# Patient Record
Sex: Male | Born: 1990 | Hispanic: Yes | Marital: Single | Smoking: Never smoker
Health system: Southern US, Community
[De-identification: ages and names within clinical notes are randomized; demographics above are authoritative.]

## PROBLEM LIST (undated history)

## (undated) DIAGNOSIS — F419 Anxiety disorder, unspecified: Secondary | ICD-10-CM

## (undated) HISTORY — PX: APPENDECTOMY: SHX54

---
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
## 2014-04-05 ENCOUNTER — Emergency Department: Payer: Self-pay | Admitting: Student

## 2014-04-05 LAB — BASIC METABOLIC PANEL
ANION GAP: 9 (ref 7–16)
BUN: 10 mg/dL (ref 7–18)
CHLORIDE: 102 mmol/L (ref 98–107)
CREATININE: 0.83 mg/dL (ref 0.60–1.30)
Calcium, Total: 9.1 mg/dL (ref 8.5–10.1)
Co2: 27 mmol/L (ref 21–32)
EGFR (Non-African Amer.): >60
Glucose: 121 mg/dL — ABNORMAL HIGH (ref 65–99)
Osmolality: 276 (ref 275–301)
Potassium: 3.7 mmol/L (ref 3.5–5.1)
SODIUM: 138 mmol/L (ref 136–145)

## 2014-04-05 LAB — URINALYSIS, COMPLETE
BLOOD: NEGATIVE
Bacteria: NONE SEEN
Bilirubin,UR: NEGATIVE
Glucose,UR: NEGATIVE mg/dL (ref 0–75)
Leukocyte Esterase: NEGATIVE
Nitrite: NEGATIVE
PH: 6 (ref 4.5–8.0)
Protein: NEGATIVE
RBC, UR: NONE SEEN /HPF (ref 0–5)
SPECIFIC GRAVITY: 1.013 (ref 1.003–1.030)

## 2014-04-05 LAB — CBC WITH DIFFERENTIAL/PLATELET
BASOS ABS: 0.1 10*3/uL (ref 0.0–0.1)
Basophil %: 0.5 %
EOS ABS: 0 10*3/uL (ref 0.0–0.7)
Eosinophil %: 0.1 %
HCT: 46.5 % (ref 40.0–52.0)
HGB: 15.9 g/dL (ref 13.0–18.0)
Lymphocyte #: 1 10*3/uL (ref 1.0–3.6)
Lymphocyte %: 7.9 %
MCH: 30 pg (ref 26.0–34.0)
MCHC: 34.2 g/dL (ref 32.0–36.0)
MCV: 88 fL (ref 80–100)
MONOS PCT: 3.1 %
Monocyte #: 0.4 x10 3/mm (ref 0.2–1.0)
NEUTROS ABS: 11.6 10*3/uL — AB (ref 1.4–6.5)
NEUTROS PCT: 88.4 %
Platelet: 237 10*3/uL (ref 150–440)
RBC: 5.29 10*6/uL (ref 4.40–5.90)
RDW: 13.2 % (ref 11.5–14.5)
WBC: 13.2 10*3/uL — AB (ref 3.8–10.6)

## 2014-04-05 LAB — TROPONIN I

## 2014-06-20 ENCOUNTER — Emergency Department: Admit: 2014-06-20 | Disposition: A | Discharge status: Home / Self Care | Payer: Self-pay | Admitting: Student

## 2015-08-28 ENCOUNTER — Encounter: Payer: Self-pay | Admitting: Emergency Medicine

## 2015-08-28 ENCOUNTER — Emergency Department: Payer: Self-pay

## 2015-08-28 ENCOUNTER — Emergency Department
Admission: EM | Admit: 2015-08-28 | Discharge: 2015-08-28 | Disposition: A | Discharge status: Home / Self Care | Payer: Self-pay | Attending: Emergency Medicine | Admitting: Emergency Medicine

## 2015-08-28 DIAGNOSIS — M791 Myalgia: Secondary | ICD-10-CM | POA: Insufficient documentation

## 2015-08-28 DIAGNOSIS — M7918 Myalgia, other site: Secondary | ICD-10-CM

## 2015-08-28 MED ORDER — CYCLOBENZAPRINE HCL 10 MG PO TABS
10.0000 mg | ORAL_TABLET | Freq: Once | ORAL | Status: AC
Start: 2015-08-28 — End: 2015-08-28
  Administered 2015-08-28: 10 mg via ORAL
  Filled 2015-08-28: qty 1

## 2015-08-28 MED ORDER — CYCLOBENZAPRINE HCL 10 MG PO TABS: 10.0000 mg | ORAL_TABLET | Freq: Three times a day (TID) | ORAL | Status: AC | PRN

## 2015-08-28 NOTE — ED Provider Notes (Signed)
[facility] Emergency Department Provider Note  ____________________________________________  Time seen: 5:30 AM  I have reviewed the triage vital signs and the nursing notes.  History Limited secondary very poor historian HISTORY  Chief Complaint Neck Pain    HPI Devin Rush is a 25 y.o. male presents with 5 out of 10 pain to the base of his neck 2 days that is worse with movement. Patient states that he has tingling in his bilateral hands "feels like my hands a fall asleep". Patient states that he does not have pain at this time "my neck just feels stiff. Patient denies any fever afebrile on presentation temperature 97.9. Patient denies any injury    Past medical history None There are no active problems to display for this patient.   Past surgical history None No current outpatient prescriptions on file.  Allergies No known drug allergies No family history on file.  Social History Social History  Substance Use Topics  . Smoking status: Never Smoker   . Smokeless tobacco: None  . Alcohol Use: No    Review of Systems  Constitutional: Negative for fever. Eyes: Negative for visual changes. ENT: Negative for sore throat.Positive for posterior neck pain Cardiovascular: Negative for chest pain. Respiratory: Negative for shortness of breath. Gastrointestinal: Negative for abdominal pain, vomiting and diarrhea. Genitourinary: Negative for dysuria. Musculoskeletal: Negative for back pain. Skin: Negative for rash. Neurological: Negative for headaches, focal weakness or numbness.   10-point ROS otherwise negative.  ____________________________________________   PHYSICAL EXAM:  VITAL SIGNS: ED Triage Vitals  Enc Vitals Group     BP 08/28/15 0435 135/81 mmHg     Pulse Rate 08/28/15 0435 69     Resp 08/28/15 0435 20     Temp 08/28/15 0435 97.9 F (36.6 C)     Temp Source 08/28/15 0435 Oral     SpO2 08/28/15 0435 100 %      Weight 08/28/15 0435 146 lb (66.225 kg)     Height 08/28/15 0435 5\' 5"  (1.651 m)     Head Cir --      Peak Flow --      Pain Score 08/28/15 0434 6     Pain Loc --      Pain Edu? --      Excl. in GC? --     Constitutional: Alert and oriented. Well appearing and in no distress. Eyes: Conjunctivae are normal. PERRL. Normal extraocular movements. ENT   Head: Normocephalic and atraumatic.   Nose: No congestion/rhinnorhea.   Mouth/Throat: Mucous membranes are moist.   Neck: No stridor. Hematological/Lymphatic/Immunilogical: No cervical lymphadenopathy. Cardiovascular: Normal rate, regular rhythm. Normal and symmetric distal pulses are present in all extremities. No murmurs, rubs, or gallops. Respiratory: Normal respiratory effort without tachypnea nor retractions. Breath sounds are clear and equal bilaterally. No wheezes/rales/rhonchi. Gastrointestinal: Soft and nontender. No distention. There is no CVA tenderness. Genitourinary: deferred Musculoskeletal: Nontender with normal range of motion in all extremities. No joint effusions.  No lower extremity tenderness nor edema. Neurologic:  Normal speech and language. No gross focal neurologic deficits are appreciated. Speech is normal.  Skin:  Skin is warm, dry and intact. No rash noted. Psychiatric: Mood and affect are normal. Speech and behavior are normal. Patient exhibits appropriate insight and judgment.      RADIOLOGY  DG Cervical Spine Complete (Final result) Result time: 08/28/15 06:40:57   Final result by Rad Results In Interface (08/28/15 06:40:57)   Narrative:   CLINICAL DATA: Initial evaluation  for upper neck, C-spine pain posteriorly for 3 days.  EXAM: CERVICAL SPINE - COMPLETE 4+ VIEW  COMPARISON: None.  FINDINGS: Reversal of the normal cervical lordosis with apex at C4. No listhesis or subluxation. Vertebral body heights preserved. No fracture. Normal C1-2 articulations are preserved and the dens  is intact. No significant degenerative changes. No prevertebral soft tissue swelling. Visualized lung apices are clear.  IMPRESSION: 1. No acute fracture or subluxation. 2. Reversal of the normal cervical lordosis, which may be related to positioning or muscular spasm.   Electronically Signed By: Rise MuBenjamin McClintock M.D. On: 08/28/2015 06:40       INITIAL IMPRESSION / ASSESSMENT AND PLAN / ED COURSE  Pertinent labs & imaging results that were available during my care of the patient were reviewed by me and considered in my medical decision making (see chart for details).  Patient given Flexeril 10 mg tablet and emergency department will be prescribed same for home  ____________________________________________   FINAL CLINICAL IMPRESSION(S) / ED DIAGNOSES  Final diagnoses:  Musculoskeletal pain      Darci Currentandolph N Lorilee Cafarella, MD 08/28/15 [phone]

## 2015-08-28 NOTE — ED Notes (Signed)
Pt reports he cannot swallow pills. Called pharmacy to verify ok to crush flexeril - OK per  Nate

## 2015-08-28 NOTE — ED Notes (Signed)
Patient ambulatory to triage with steady gait, without difficulty or distress noted; pt reports x 2 days having pain from base of neck radiating up into back of head and sensation of "fingers falsely falling off"; denies any recent illness, denies HA, denies any injuries; denies hx of same

## 2015-08-28 NOTE — ED Notes (Signed)
Pt reports stiffness to neck area and feeling like he cannot move neck properly.  Pt having no tenderness to touch.  Pt also reports having intermittent feelings of fingers falling asleep.

## 2015-08-28 NOTE — Discharge Instructions (Signed)
Muscle Pain, Adult  Muscle pain (myalgia) may be caused by many things, including:  · Overuse or muscle strain, especially if you are not in shape. This is the most common cause of muscle pain.  · Injury.  · Bruises.  · Viruses, such as the flu.  · Infectious diseases.  · Fibromyalgia, which is a chronic condition that causes muscle tenderness, fatigue, and headache.  · Autoimmune diseases, including lupus.  · Certain drugs, including ACE inhibitors and statins.  Muscle pain may be mild or severe. In most cases, the pain lasts only a short time and goes away without treatment. To diagnose the cause of your muscle pain, your health care provider will take your medical history. This means he or she will ask you when your muscle pain began and what has been happening. If you have not had muscle pain for very long, your health care provider may want to wait before doing much testing. If your muscle pain has lasted a long time, your health care provider may want to run tests right away. If your health care provider thinks your muscle pain may be caused by illness, you may need to have additional tests to rule out certain conditions.   Treatment for muscle pain depends on the cause. Home care is often enough to relieve muscle pain. Your health care provider may also prescribe anti-inflammatory medicine.  HOME CARE INSTRUCTIONS  Watch your condition for any changes. The following actions may help to lessen any discomfort you are feeling:  · Only take over-the-counter or prescription medicines as directed by your health care provider.  · Apply ice to the sore muscle:    Put ice in a plastic bag.    Place a towel between your skin and the bag.    Leave the ice on for 15-20 minutes, 3-4 times a day.  · You may alternate applying hot and cold packs to the muscle as directed by your health care provider.  · If overuse is causing your muscle pain, slow down your activities until the pain goes away.    Remember that it is normal  to feel some muscle pain after starting a workout program. Muscles that have not been used often will be sore at first.    Do regular, gentle exercises if you are not usually active.    Warm up before exercising to lower your risk of muscle pain.  · Do not continue working out if the pain is very bad. Bad pain could mean you have injured a muscle.  SEEK MEDICAL CARE IF:  · Your muscle pain gets worse, and medicines do not help.  · You have muscle pain that lasts longer than 3 days.  · You have a rash or fever along with muscle pain.  · You have muscle pain after a tick bite.  · You have muscle pain while working out, even though you are in good physical condition.  · You have redness, soreness, or swelling along with muscle pain.  · You have muscle pain after starting a new medicine or changing the dose of a medicine.  SEEK IMMEDIATE MEDICAL CARE IF:  · You have trouble breathing.  · You have trouble swallowing.  · You have muscle pain along with a stiff neck, fever, and vomiting.  · You have severe muscle weakness or cannot move part of your body.  MAKE SURE YOU:   · Understand these instructions.  · Will watch your condition.  · Will get   help right away if you are not doing well or get worse.     This information is not intended to replace advice given to you by your health care provider. Make sure you discuss any questions you have with your health care provider.     Document Released: 01/13/2006 Document Revised: 03/14/2014 Document Reviewed: 12/18/2012  Elsevier Interactive Patient Education ©2016 [employer]

## 2017-11-04 ENCOUNTER — Emergency Department
Admission: EM | Admit: 2017-11-04 | Discharge: 2017-11-04 | Disposition: A | Discharge status: Home / Self Care | Payer: Self-pay | Attending: Emergency Medicine | Admitting: Emergency Medicine

## 2017-11-04 ENCOUNTER — Encounter: Payer: Self-pay | Admitting: Emergency Medicine

## 2017-11-04 ENCOUNTER — Other Ambulatory Visit: Payer: Self-pay

## 2017-11-04 DIAGNOSIS — R531 Weakness: Secondary | ICD-10-CM | POA: Insufficient documentation

## 2017-11-04 DIAGNOSIS — R55 Syncope and collapse: Secondary | ICD-10-CM | POA: Insufficient documentation

## 2017-11-04 DIAGNOSIS — R42 Dizziness and giddiness: Secondary | ICD-10-CM | POA: Insufficient documentation

## 2017-11-04 HISTORY — DX: Anxiety disorder, unspecified: F41.9

## 2017-11-04 LAB — CBC
HCT: 47.9 % (ref 40.0–52.0)
HEMOGLOBIN: 16.8 g/dL (ref 13.0–18.0)
MCH: 31.6 pg (ref 26.0–34.0)
MCHC: 35.1 g/dL (ref 32.0–36.0)
MCV: 90 fL (ref 80.0–100.0)
Platelets: 234 10*3/uL (ref 150–440)
RBC: 5.32 MIL/uL (ref 4.40–5.90)
RDW: 13.1 % (ref 11.5–14.5)
WBC: 6 10*3/uL (ref 3.8–10.6)

## 2017-11-04 LAB — URINALYSIS, COMPLETE (UACMP) WITH MICROSCOPIC
BILIRUBIN URINE: NEGATIVE
Bacteria, UA: NONE SEEN
GLUCOSE, UA: NEGATIVE mg/dL
Hgb urine dipstick: NEGATIVE
KETONES UR: NEGATIVE mg/dL
Leukocytes, UA: NEGATIVE
Nitrite: NEGATIVE
PROTEIN: NEGATIVE mg/dL
Specific Gravity, Urine: 1.024 (ref 1.005–1.030)
Squamous Epithelial / LPF: NONE SEEN (ref 0–5)
pH: 6 (ref 5.0–8.0)

## 2017-11-04 LAB — TROPONIN I: Troponin I: <0.03 ng/mL (ref <0.03)

## 2017-11-04 LAB — BASIC METABOLIC PANEL
Anion gap: 9 (ref 5–15)
BUN: 18 mg/dL (ref 6–20)
CALCIUM: 9.5 mg/dL (ref 8.9–10.3)
CO2: 23 mmol/L (ref 22–32)
Chloride: 106 mmol/L (ref 98–111)
Creatinine, Ser: 0.68 mg/dL (ref 0.61–1.24)
GFR calc non Af Amer: >60 mL/min (ref >60)
Glucose, Bld: 108 mg/dL — ABNORMAL HIGH (ref 70–99)
Potassium: 3.5 mmol/L (ref 3.5–5.1)
Sodium: 138 mmol/L (ref 135–145)

## 2017-11-04 NOTE — ED Notes (Signed)
orthostatic v/s  142/94  Laying pulse  80  sitting 133/88  bp pulse 75 Standing  133/93  Pulse 84

## 2017-11-04 NOTE — ED Provider Notes (Signed)
[facility] Emergency Department Provider Note ____________________________________________   First MD Initiated Contact with Patient 11/04/17 1517     (approximate)  I have reviewed the triage vital signs and the nursing notes.   HISTORY  Chief Complaint Weakness  HPI Devin Rush is a 27 y.o. male history of anxiety was presented to the emergency department with 1 week of feeling lightheaded when getting up from a laying down or sitting position.  The patient states that he feels lightheaded for approximately 1 hour after changing positions.  Denies any nausea, vomiting or diarrhea.  He states that he has been having a normal diet, eating and drinking normally.  Denies being anxious as has been a reason for previous ER visit.  Denies any chest pain or shortness of breath.  Says that he has an uncle who died suddenly in his sleep at 126 years old who is overweight but otherwise no other relatives dying suddenly young ages and no known history of arrhythmia in the family.  Patient says that he drinks occasionally including 2 days ago and says that he felt sick yesterday but does not feel any aftereffects today.  Says that he drinks usually once per week.  Does not report any other drug use.  Does not report any burning or frequency with urination.  Patient says that he works in a [employer] and that it is usually cool and not excessively hot.  Past Medical History:  Diagnosis Date  . Anxiety     There are no active problems to display for this patient.   Past Surgical History:  Procedure Laterality Date  . APPENDECTOMY      Prior to Admission medications   Not on File    Allergies Patient has no known allergies.  No family history on file.  Social History Social History   Tobacco Use  . Smoking status: Never Smoker  . Smokeless tobacco: Never Used  Substance Use Topics  . Alcohol use: No  . Drug use: Not on file     Review of Systems  Constitutional: No fever/chills Eyes: No visual changes. ENT: No sore throat. Cardiovascular: Denies chest pain. Respiratory: Denies shortness of breath. Gastrointestinal: No abdominal pain.  No nausea, no vomiting.  No diarrhea.  No constipation. Genitourinary: Negative for dysuria. Musculoskeletal: Negative for back pain. Skin: Negative for rash. Neurological: Negative for headaches, focal weakness or numbness.   ____________________________________________   PHYSICAL EXAM:  VITAL SIGNS: ED Triage Vitals  Enc Vitals Group     BP 11/04/17 1212 120/83     Pulse Rate 11/04/17 1212 85     Resp 11/04/17 1212 16     Temp 11/04/17 1212 98.7 F (37.1 C)     Temp Source 11/04/17 1212 Oral     SpO2 11/04/17 1212 100 %     Weight 11/04/17 1211 150 lb (68 kg)     Height 11/04/17 1211 5\' 4"  (1.626 m)     Head Circumference --      Peak Flow --      Pain Score 11/04/17 1211 0     Pain Loc --      Pain Edu? --      Excl. in GC? --     Constitutional: Alert and oriented. Well appearing and in no acute distress. Eyes: Conjunctivae are normal.  Head: Atraumatic. Nose: No congestion/rhinnorhea. Mouth/Throat: Mucous membranes are moist.  Neck: No stridor.   Cardiovascular: Normal rate, regular rhythm. Grossly normal  heart sounds.   Respiratory: Normal respiratory effort.  No retractions. Lungs CTAB. Gastrointestinal: Soft and nontender. No distention. Musculoskeletal: No lower extremity tenderness nor edema.  No joint effusions. Neurologic:  Normal speech and language. No gross focal neurologic deficits are appreciated. Skin:  Skin is warm, dry and intact. No rash noted. Psychiatric: Mood and affect are normal. Speech and behavior are normal.  ____________________________________________   LABS (all labs ordered are listed, but only abnormal results are displayed)  Labs Reviewed  BASIC METABOLIC PANEL - Abnormal; Notable for the following components:       Result Value   Glucose, Bld 108 (*)    All other components within normal limits  URINALYSIS, COMPLETE (UACMP) WITH MICROSCOPIC - Abnormal; Notable for the following components:   Color, Urine YELLOW (*)    APPearance CLEAR (*)    All other components within normal limits  CBC  TROPONIN I  CBG MONITORING, ED   ____________________________________________  EKG  ED ECG REPORT I, Arelia Longest, the attending physician, personally viewed and interpreted this ECG.   Date: 11/04/2017  EKG Time: 1212  Rate: 80  Rhythm: normal sinus rhythm  Axis: Normal  Intervals:none  ST&T Change: T wave inversions in 3 as well as aVF.  Deep S wave in 2 as well as deep Q wave 3.  No significant changes from previous EKG of 2016 except for very subtle T wave inversion now and aVF.  ____________________________________________  RADIOLOGY   ____________________________________________   PROCEDURES  Procedure(s) performed:   Procedures  Critical Care performed:   ____________________________________________   INITIAL IMPRESSION / ASSESSMENT AND PLAN / ED COURSE  Pertinent labs & imaging results that were available during my care of the patient were reviewed by me and considered in my medical decision making (see chart for details).  DDX: Dehydration, hypotension, near-syncope, electrolyte abnormality, arrhythmia, orthostatic hypotension As part of my medical decision making, I reviewed the following data within the electronic MEDICAL RECORD NUMBER Notes from prior ED visits  ----------------------------------------- 4:24 PM on 11/04/2017 -----------------------------------------  Patient without significant orthostatic changes.  Reassuring labs overall.  I do not see a reason to keep this patient further in the emergency department further testing.  Believe that he can be seen as an outpatient for further work-up.  The patient and I discussed the lab results and further  recommendations to stay hydrated and to decrease the amount of drinking he does that he sometimes admits to drinking heavily.  Patient will follow up with primary care.  To be discharged at this time.  No further complaints for the patient.  Appears well. ____________________________________________   FINAL CLINICAL IMPRESSION(S) / ED DIAGNOSES  Near syncope.  NEW MEDICATIONS STARTED DURING THIS VISIT:  New Prescriptions   No medications on file     Note:  This document was prepared using Dragon voice recognition software and may include unintentional dictation errors.     Myrna Blazer, MD 11/04/17 [phone]

## 2017-11-04 NOTE — ED Triage Notes (Signed)
C/O intermittent lightheadedness x 3 weeks.  States feeling dizzy today.   AAOx3.  Skin warm and dry.  MAE equally and strong.  Gait easy and steady.  NAD

## 2017-11-04 NOTE — ED Notes (Signed)
Pt reports  Weakness and dizziness lightheaded for 3 weeks -  Pt  occasosonal drinker  Had consumed lots of etoh sev days ago  Now is awake and alert ambulated to room with a steady fluid gait

## 2018-09-10 ENCOUNTER — Emergency Department
Admission: EM | Admit: 2018-09-10 | Discharge: 2018-09-10 | Disposition: A | Discharge status: Home / Self Care | Payer: Self-pay | Attending: Emergency Medicine | Admitting: Emergency Medicine

## 2018-09-10 ENCOUNTER — Emergency Department: Payer: Self-pay

## 2018-09-10 ENCOUNTER — Other Ambulatory Visit: Payer: Self-pay

## 2018-09-10 DIAGNOSIS — R079 Chest pain, unspecified: Secondary | ICD-10-CM | POA: Insufficient documentation

## 2018-09-10 DIAGNOSIS — F419 Anxiety disorder, unspecified: Secondary | ICD-10-CM | POA: Insufficient documentation

## 2018-09-10 DIAGNOSIS — R0789 Other chest pain: Secondary | ICD-10-CM

## 2018-09-10 DIAGNOSIS — R202 Paresthesia of skin: Secondary | ICD-10-CM | POA: Insufficient documentation

## 2018-09-10 LAB — CBC
HCT: 45 % (ref 39.0–52.0)
Hemoglobin: 15.6 g/dL (ref 13.0–17.0)
MCH: 30.4 pg (ref 26.0–34.0)
MCHC: 34.7 g/dL (ref 30.0–36.0)
MCV: 87.5 fL (ref 80.0–100.0)
Platelets: 259 10*3/uL (ref 150–400)
RBC: 5.14 MIL/uL (ref 4.22–5.81)
RDW: 13.1 % (ref 11.5–15.5)
WBC: 5.9 10*3/uL (ref 4.0–10.5)
nRBC: 0 % (ref 0.0–0.2)

## 2018-09-10 LAB — BASIC METABOLIC PANEL
Anion gap: 10 (ref 5–15)
BUN: 15 mg/dL (ref 6–20)
CO2: 22 mmol/L (ref 22–32)
Calcium: 9.3 mg/dL (ref 8.9–10.3)
Chloride: 102 mmol/L (ref 98–111)
Creatinine, Ser: 0.66 mg/dL (ref 0.61–1.24)
GFR calc Af Amer: >60 mL/min (ref >60)
GFR calc non Af Amer: >60 mL/min (ref >60)
Glucose, Bld: 107 mg/dL — ABNORMAL HIGH (ref 70–99)
Potassium: 3.8 mmol/L (ref 3.5–5.1)
Sodium: 134 mmol/L — ABNORMAL LOW (ref 135–145)

## 2018-09-10 NOTE — ED Provider Notes (Signed)
[facility] Emergency Department Provider Note   ____________________________________________    I have reviewed the triage vital signs and the nursing notes.   HISTORY  Chief Complaint Numbness and Anxiety     HPI Devin Rush is a 28 y.o. male who presents with complaints of tingling in his fingers bilaterally intermittently over the last 1 to 2 weeks.  He is not sure if this is related to anxiety or not he does have a history of panic attacks.  Earlier today he had a tightness in his chest which lasted 1 to 2 seconds, has not had that again.  Sometimes he notes he has shortness of breath, none now.  No fevers or chills or cough.  No exposure to COVID-19 patients.  No nausea or vomiting.  Currently is asymptomatic.  Past Medical History:  Diagnosis Date  . Anxiety     There are no active problems to display for this patient.   Past Surgical History:  Procedure Laterality Date  . APPENDECTOMY      Prior to Admission medications   Not on File     Allergies Patient has no known allergies.  No family history on file.  Social History Social History   Tobacco Use  . Smoking status: Never Smoker  . Smokeless tobacco: Never Used  Substance Use Topics  . Alcohol use: No  . Drug use: Not on file    Review of Systems  Constitutional: No fever/chills Eyes: No visual changes.  ENT: No neck pain Cardiovascular: Denies chest pain. Respiratory: Denies shortness of breath. Gastrointestinal: No abdominal pain.  No nausea, no vomiting.   Genitourinary: Negative for dysuria. Musculoskeletal: Negative for back pain. Skin: Negative for rash. Neurological: Negative for headaches, as above, no weakness  ____________________________________________   PHYSICAL EXAM:  VITAL SIGNS: ED Triage Vitals  Enc Vitals Group     BP 09/10/18 1308 (!) 141/81     Pulse Rate 09/10/18 1308 98     Resp 09/10/18 1308 18     Temp 09/10/18 1308  99.1 F (37.3 C)     Temp Source 09/10/18 1308 Oral     SpO2 09/10/18 1308 97 %     Weight 09/10/18 1309 69.9 kg (154 lb)     Height 09/10/18 1309 1.6 m (5\' 3" )     Head Circumference --      Peak Flow --      Pain Score 09/10/18 1308 0     Pain Loc --      Pain Edu? --      Excl. in GC? --     Constitutional: Alert and oriented. No acute distress.  Eyes: Conjunctivae are normal.   Nose: No congestion/rhinnorhea. Mouth/Throat: Mucous membranes are moist.   Neck:  Painless ROM Cardiovascular: Normal rate, regular rhythm. Grossly normal heart sounds.  Good peripheral circulation. Respiratory: Normal respiratory effort.  No retractions. Lungs CTAB. Gastrointestinal: Soft and nontender. No distention.    Musculoskeletal:   Warm and well perfused Neurologic:  Normal speech and language. No gross focal neurologic deficits are appreciated.  Cranial nerves II to XII normal Skin:  Skin is warm, dry and intact. No rash noted. Psychiatric: Mood and affect are normal. Speech and behavior are normal.  ____________________________________________   LABS (all labs ordered are listed, but only abnormal results are displayed)  Labs Reviewed  BASIC METABOLIC PANEL - Abnormal; Notable for the following components:      Result Value   Sodium  134 (*)    Glucose, Bld 107 (*)    All other components within normal limits  CBC   ____________________________________________  EKG  ED ECG REPORT I, Lavonia Drafts, the attending physician, personally viewed and interpreted this ECG.  Date: 09/10/2018  Rhythm: normal sinus rhythm QRS Axis: normal Intervals: normal ST/T Wave abnormalities: normal Narrative Interpretation: no evidence of acute ischemia  ____________________________________________  RADIOLOGY  Chest x-ray unremarkable ____________________________________________   PROCEDURES  Procedure(s) performed: No  Procedures   Critical Care performed: No  ____________________________________________   INITIAL IMPRESSION / ASSESSMENT AND PLAN / ED COURSE  Pertinent labs & imaging results that were available during my care of the patient were reviewed by me and considered in my medical decision making (see chart for details).  Patient presents with intermittent paresthesias to the fingertips bilaterally, no neck pain, no neuro deficits at this time.  Brief episode of atypical chest pain, not consistent with ACS, quite reassuring EKG.  Chest x-ray labs overall unremarkable, recommend outpatient follow-up with PCP, neurology as needed.  Return precautions discussed.    ____________________________________________   FINAL CLINICAL IMPRESSION(S) / ED DIAGNOSES  Final diagnoses:  Paresthesias  Atypical chest pain        Note:  This document was prepared using Dragon voice recognition software and may include unintentional dictation errors.   Lavonia Drafts, MD 09/10/18 1459

## 2018-09-10 NOTE — ED Notes (Signed)
See triage note  Presents numbness all over and some tightness in chest  sxs' started yesterday   Hx of panic attacks in past  Dr Corky Downs in with pt

## 2018-09-10 NOTE — ED Triage Notes (Signed)
Pt c/o intermittent positional numbness in BL hands, states yesterday while driving he had some heaviness in his chest and had to pull over because he got scared and upset. States he has a hx of panic attacks but doesn't feel like this is the same. Pt is in NAD.

## 2020-03-30 ENCOUNTER — Emergency Department: Payer: Self-pay

## 2020-03-30 ENCOUNTER — Other Ambulatory Visit: Payer: Self-pay

## 2020-03-30 ENCOUNTER — Emergency Department
Admission: EM | Admit: 2020-03-30 | Discharge: 2020-03-30 | Disposition: A | Discharge status: Home / Self Care | Payer: Self-pay | Attending: Emergency Medicine | Admitting: Emergency Medicine

## 2020-03-30 ENCOUNTER — Encounter: Payer: Self-pay | Admitting: Emergency Medicine

## 2020-03-30 DIAGNOSIS — R109 Unspecified abdominal pain: Secondary | ICD-10-CM | POA: Insufficient documentation

## 2020-03-30 DIAGNOSIS — R0789 Other chest pain: Secondary | ICD-10-CM | POA: Insufficient documentation

## 2020-03-30 LAB — HEPATIC FUNCTION PANEL
ALT: 59 U/L — ABNORMAL HIGH (ref 0–44)
AST: 28 U/L (ref 15–41)
Albumin: 4.4 g/dL (ref 3.5–5.0)
Alkaline Phosphatase: 80 U/L (ref 38–126)
Bilirubin, Direct: 0.1 mg/dL (ref 0.0–0.2)
Indirect Bilirubin: 1 mg/dL — ABNORMAL HIGH (ref 0.3–0.9)
Total Bilirubin: 1.1 mg/dL (ref 0.3–1.2)
Total Protein: 8.1 g/dL (ref 6.5–8.1)

## 2020-03-30 LAB — BASIC METABOLIC PANEL
Anion gap: 12 (ref 5–15)
BUN: 14 mg/dL (ref 6–20)
CO2: 25 mmol/L (ref 22–32)
Calcium: 9.4 mg/dL (ref 8.9–10.3)
Chloride: 100 mmol/L (ref 98–111)
Creatinine, Ser: 0.75 mg/dL (ref 0.61–1.24)
GFR, Estimated: >60 mL/min (ref >60)
Glucose, Bld: 103 mg/dL — ABNORMAL HIGH (ref 70–99)
Potassium: 3.6 mmol/L (ref 3.5–5.1)
Sodium: 137 mmol/L (ref 135–145)

## 2020-03-30 LAB — TROPONIN I (HIGH SENSITIVITY): Troponin I (High Sensitivity): 4 ng/L (ref <18)

## 2020-03-30 LAB — CBC
HCT: 43.9 % (ref 39.0–52.0)
Hemoglobin: 15.3 g/dL (ref 13.0–17.0)
MCH: 30.5 pg (ref 26.0–34.0)
MCHC: 34.9 g/dL (ref 30.0–36.0)
MCV: 87.5 fL (ref 80.0–100.0)
Platelets: 278 10*3/uL (ref 150–400)
RBC: 5.02 MIL/uL (ref 4.22–5.81)
RDW: 12.7 % (ref 11.5–15.5)
WBC: 5.4 10*3/uL (ref 4.0–10.5)
nRBC: 0 % (ref 0.0–0.2)

## 2020-03-30 LAB — LIPASE, BLOOD: Lipase: 27 U/L (ref 11–51)

## 2020-03-30 NOTE — ED Provider Notes (Signed)
[facility] Emergency Department Provider Note   ____________________________________________   Event Date/Time   First MD Initiated Contact with Patient 03/30/20 1250     (approximate)  I have reviewed the triage vital signs and the nursing notes.   HISTORY  Chief Complaint Chest Pain    HPI Devin Rush is a 30 y.o. male reports no significant past medical history  He does have a distant history of appendectomy  Patient reports for about the last couple weeks he has had a few episodes where he will abdominal develop pain in his lower chest in the middle it seems to radiate pain towards his left at times.  It comes about after eats foods.  No nausea or vomiting.  Does not come about during times of exercise, but he does report noticed that when he eats food in particular he will start to get a discomfort in his lower mid chest sometimes radiating discomfort to his left side.  Currently not having symptoms but did have some earlier that occurred while eating a meal at the break room at work.  When this occurs he will start to feel anxious he reports he will start breathing fast, and last week had actually lowered himself to the ground because he thought he is going to pass out during one episode    No history of heart disease.  Does not smoke.  Uses alcohol occasionally but not in a heavy amount  Past Medical History:  Diagnosis Date  . Anxiety     There are no problems to display for this patient.   Past Surgical History:  Procedure Laterality Date  . APPENDECTOMY      Prior to Admission medications   Not on File    Allergies Patient has no known allergies.  No family history on file.  Social History Social History   Tobacco Use  . Smoking status: Never Smoker  . Smokeless tobacco: Never Used  Substance Use Topics  . Alcohol use: No  Occasional alcohol use  Review of Systems Constitutional: No fever/chills Eyes: No  visual changes. ENT: No sore throat. Cardiovascular: See HPI Respiratory: Denies shortness of breath. Gastrointestinal: Some discomfort in his very upper abdomen and his lower chest wall. Genitourinary: Negative for dysuria. Musculoskeletal: Negative for back pain. Skin: Negative for rash. Neurological: Negative for headaches, areas of focal weakness or numbness.    ____________________________________________   PHYSICAL EXAM:  VITAL SIGNS: ED Triage Vitals  Enc Vitals Group     BP 03/30/20 1153 140/88     Pulse Rate 03/30/20 1153 81     Resp 03/30/20 1153 18     Temp 03/30/20 1153 98.4 F (36.9 C)     Temp Source 03/30/20 1153 Oral     SpO2 03/30/20 1153 99 %     Weight 03/30/20 1150 154 lb 1.6 oz (69.9 kg)     Height 03/30/20 1150 5\' 3"  (1.6 m)     Head Circumference --      Peak Flow --      Pain Score 03/30/20 1150 7     Pain Loc --      Pain Edu? --      Excl. in GC? --     Constitutional: Alert and oriented. Well appearing and in no acute distress. Eyes: Conjunctivae are normal. Head: Atraumatic. Nose: No congestion/rhinnorhea. Mouth/Throat: Mucous membranes are moist. Neck: No stridor.  Cardiovascular: Normal rate, regular rhythm. Grossly normal heart sounds.  Good peripheral circulation.  Respiratory: Normal respiratory effort.  No retractions. Lungs CTAB. Gastrointestinal: Soft and nontender. No distention.  Of note the patient currently reports not having pain or discomfort Musculoskeletal: No lower extremity tenderness. Neurologic:  Normal speech and language. No gross focal neurologic deficits are appreciated.  Skin:  Skin is warm, dry and intact. No rash noted. Psychiatric: Mood and affect are normal. Speech and behavior are normal.  ____________________________________________   LABS (all labs ordered are listed, but only abnormal results are displayed)  Labs Reviewed  BASIC METABOLIC PANEL - Abnormal; Notable for the following components:       Result Value   Glucose, Bld 103 (*)    All other components within normal limits  HEPATIC FUNCTION PANEL - Abnormal; Notable for the following components:   ALT 59 (*)    Indirect Bilirubin 1.0 (*)    All other components within normal limits  CBC  LIPASE, BLOOD  TROPONIN I (HIGH SENSITIVITY)   ____________________________________________  EKG  ED ECG REPORT I, Sharyn Creamer, the attending physician, personally viewed and interpreted this ECG.  Date: 03/30/2020 EKG Time: 1145 Rate: 70 Rhythm: normal sinus rhythm QRS Axis: normal Intervals: normal ST/T Wave abnormalities: normal Narrative Interpretation: no evidence of acute ischemia  ____________________________________________  RADIOLOGY  DG Chest 2 View  Result Date: 03/30/2020 CLINICAL DATA:  Chest pain and chest tightness. EXAM: CHEST - 2 VIEW COMPARISON:  09/10/2018 FINDINGS: The heart size and mediastinal contours are within normal limits. Both lungs are clear. The visualized skeletal structures are unremarkable. IMPRESSION: No active cardiopulmonary disease. Electronically Signed   By: Signa Kell M.D.   On: 03/30/2020 12:11   US ABDOMEN LIMITED RUQ (LIVER/GB)  Result Date: 03/30/2020 CLINICAL DATA:  Right upper quadrant abdominal pain for 1 week EXAM: ULTRASOUND ABDOMEN LIMITED RIGHT UPPER QUADRANT COMPARISON:  None. FINDINGS: Gallbladder: No gallstones or wall thickening visualized. No sonographic Murphy sign noted by sonographer. Common bile duct: Diameter: 5 mm Liver: No focal lesion. Diffuse increase in echogenicity. Portal vein is patent on color Doppler imaging with normal direction of blood flow towards the liver. Other: None. IMPRESSION: Diffuse increased echogenicity of the hepatic parenchyma is a nonspecific indicator of hepatocellular dysfunction, most commonly steatosis. Electronically Signed   By: Acquanetta Belling M.D.   On: 03/30/2020 13:47    Chest x-ray normal.  Right upper quadrant ultrasound reviewed,  increased echogenicity of the liver without evidence of acute biliary process ____________________________________________   PROCEDURES  Procedure(s) performed: None  Procedures  Critical Care performed: No  ____________________________________________   INITIAL IMPRESSION / ASSESSMENT AND PLAN / ED COURSE  Pertinent labs & imaging results that were available during my care of the patient were reviewed by me and considered in my medical decision making (see chart for details).   Patient presents for evaluation of episodic lower chest wall versus epigastric discomfort.  Seems to be very much related to eating.  Differential diagnosis would include esophagitis, gastritis, reflux, felt less likely to be pancreatic or hepatocellular disease.  Additionally has symptoms atypical of ACS.  Not related to exercise, but does occasionally radiate discomfort towards his left arm.  No noted risk factors for ACS.  EKG and troponin reassuring  HEAR score < 2.  ----------------------------------------- 2:29 PM on 03/30/2020 -----------------------------------------  Patient resting comfortably, discussed ultrasound findings and my suspicion that his symptoms may be related to gastrointestinal cause such as esophagitis, but I did recommend to him follow-up with cardiology for additional testing given his associated chest discomfort as  well as potentially follow-up with a gastroenterologist.  Patient is currently awaiting visit with primary care doctor which he reports is not able to see for about a month as he is establishing a new PCP now  LFTs slight elevation of ALT.somewhat suspected to have steatosis though exact etiology of hepatic parenchymal disease is not clear at this time.  Patient does understand my recommendation for follow-up with both cardiology and GI  Return precautions and treatment recommendations and follow-up discussed with the patient who is agreeable with the plan.        ____________________________________________   FINAL CLINICAL IMPRESSION(S) / ED DIAGNOSES  Final diagnoses:  Abdominal pain  Atypical chest pain        Note:  This document was prepared using Dragon voice recognition software and may include unintentional dictation errors       Sharyn Creamer, MD 03/30/20 1607

## 2020-03-30 NOTE — Discharge Instructions (Addendum)
Please follow-up with a primary care physician as you are setting this up.  In the interim I do recommend a follow-up with cardiology and gastroenterology to further evaluate cause of your chest pain that seems to be related to eating.  Return to the ER right away if your symptoms worsen, you feel faint or pass out, have trouble breathing, develop a fever, severe pain or other new concerns arise

## 2020-03-30 NOTE — ED Triage Notes (Signed)
Patient to ER for c/o left sided chest pain and tightness to left arm. Patient reports having episode recently (within last week) that he had near syncopal episode and blood pressure was high per EMS to patient at that time. +Mild shortness of breath.

## 2020-03-30 NOTE — ED Notes (Signed)
Pt being discharged from triage. Pt states left sided chest pain that started 3 days ago.

## 2020-05-06 ENCOUNTER — Ambulatory Visit: Payer: Self-pay | Admitting: Adult Health

## 2020-08-05 ENCOUNTER — Other Ambulatory Visit: Payer: Self-pay

## 2020-08-05 NOTE — Progress Notes (Signed)
BP 136/88   Pulse 81   Temp 97.8 F (36.6 C)   Ht 5' 4.88" (1.648 m)   Wt 172 lb 4 oz (78.1 kg)   SpO2 98%   BMI 28.77 kg/m    Subjective:    Patient ID: Devin Rush, male    DOB: Mar 13, 1990, 30 y.o.   MRN: [id number]  HPI: Devin Rush is a 30 y.o. male  Chief Complaint  Patient presents with  . Establish Care  . Gastroesophageal Reflux  . Anxiety   Patient presents to clinic to establish care with new PCP.  Patient reports a history of Anxiety, Depression, and GERD.  Patient denies a history of: Hypertension, Elevated Cholesterol, Thyroid problems, Diabetes, Neurological problems, and Abdominal problems.   ANXIETY/DEPRESSION Patient states he gets in certain situations and that makes him feel anxious.  He has panic attacks when his anxiety heightens.  Patient feels like he has panic attacks at least 1-2 times per week. Patient states he has been in the ER several times.  Patient states he is having trouble with lightheadedness and difficulty breathing. Denies SI.   GAD 7 : Generalized Anxiety Score 08/06/2020  Nervous, Anxious, on Edge 2  Control/stop worrying 2  Worry too much - different things 3  Trouble relaxing 2  Restless 2  Easily annoyed or irritable 2  Afraid - awful might happen 2  Total GAD 7 Score 15  Anxiety Difficulty Somewhat difficult    Flowsheet Row Office Visit from 08/06/2020 in [facility]  PHQ-9 Total Score 13      GERD  Patient states he has tried pepcid and tums. The Pepcid works but does not take it away completely.  Patient states it started as certain foods irritated him.  But now he feels like all foods are causing him to have reflux.   Relevant past medical, surgical, family and social history reviewed and updated as indicated. Interim medical history since our last visit reviewed. Allergies and medications reviewed and updated.  Review of Systems  Gastrointestinal:       Heartburn    Per HPI unless  specifically indicated above     Objective:    BP 136/88   Pulse 81   Temp 97.8 F (36.6 C)   Ht 5' 4.88" (1.648 m)   Wt 172 lb 4 oz (78.1 kg)   SpO2 98%   BMI 28.77 kg/m   Wt Readings from Last 3 Encounters:  08/06/20 172 lb 4 oz (78.1 kg)  09/10/18 154 lb (69.9 kg)  11/04/17 150 lb (68 kg)    Physical Exam Vitals and nursing note reviewed.  Constitutional:      General: He is not in acute distress.    Appearance: Normal appearance. He is not ill-appearing, toxic-appearing or diaphoretic.  HENT:     Head: Normocephalic.     Right Ear: External ear normal.     Left Ear: External ear normal.     Nose: Nose normal. No congestion or rhinorrhea.     Mouth/Throat:     Mouth: Mucous membranes are moist.  Eyes:     General:        Right eye: No discharge.        Left eye: No discharge.     Extraocular Movements: Extraocular movements intact.     Conjunctiva/sclera: Conjunctivae normal.     Pupils: Pupils are equal, round, and reactive to light.  Cardiovascular:     Rate and Rhythm: Normal  rate and regular rhythm.     Heart sounds: No murmur heard.   Pulmonary:     Effort: Pulmonary effort is normal. No respiratory distress.     Breath sounds: Normal breath sounds. No wheezing, rhonchi or rales.  Abdominal:     General: Abdomen is flat. Bowel sounds are normal.  Musculoskeletal:     Cervical back: Normal range of motion and neck supple.  Skin:    General: Skin is warm and dry.     Capillary Refill: Capillary refill takes less than 2 seconds.  Neurological:     General: No focal deficit present.     Mental Status: He is alert and oriented to person, place, and time.  Psychiatric:        Mood and Affect: Mood normal.        Behavior: Behavior normal.        Thought Content: Thought content normal.        Judgment: Judgment normal.     Results for orders placed or performed during the hospital encounter of 09/10/18  CBC  Result Value Ref Range   WBC 5.9 4.0 -  10.5 K/uL   RBC 5.14 4.22 - 5.81 MIL/uL   Hemoglobin 15.6 13.0 - 17.0 g/dL   HCT 21.1 94.1 - 74.0 %   MCV 87.5 80.0 - 100.0 fL   MCH 30.4 26.0 - 34.0 pg   MCHC 34.7 30.0 - 36.0 g/dL   RDW 81.4 48.1 - 85.6 %   Platelets 259 150 - 400 K/uL   nRBC 0.0 0.0 - 0.2 %  Basic metabolic panel  Result Value Ref Range   Sodium 134 (L) 135 - 145 mmol/L   Potassium 3.8 3.5 - 5.1 mmol/L   Chloride 102 98 - 111 mmol/L   CO2 22 22 - 32 mmol/L   Glucose, Bld 107 (H) 70 - 99 mg/dL   BUN 15 6 - 20 mg/dL   Creatinine, Ser 3.14 0.61 - 1.24 mg/dL   Calcium 9.3 8.9 - 97.0 mg/dL   GFR calc non Af Amer >60 >60 mL/min   GFR calc Af Amer >60 >60 mL/min   Anion gap 10 5 - 15      Assessment & Plan:   Problem List Items Addressed This Visit      Digestive   Hepatic steatosis    Chronic.  Stable.  Discussed during visit today.  Recommend decreasing fat in diet. Will draw labs at next visit.       GERD (gastroesophageal reflux disease)    Chronic.  Ongoing.  Patient has tried pepcid and tums. Recommend using Omeprazole daily for the next 30 days.  Once he has completed the 30 days, recommend patient use medication PRN. Patient understands and agrees with the plan of care.  Follow up in 1 month for reevaluation.      Relevant Medications   omeprazole (PRILOSEC) 20 MG capsule     Other   Anxiety and depression - Primary    Chronic.  Uncontrolled. Recommend starting Zoloft 25mg  daily. Side effects and benefits of medication discussed with patient during visit today. Discussed adding Buspar to regimen to help with panic attacks. Patient would like to start with Zoloft and think about adding Buspar in the future. Return to clinic in 1 month for reevaluation.       Relevant Medications   sertraline (ZOLOFT) 25 MG tablet    Other Visit Diagnoses    Encounter to establish care  Return in 1 month for Physical and fasting labs.        Follow up plan: Return in about 1 month (around 09/05/2020) for  Physical and Fasting labs, Depression/Anxiety FU, GERD.

## 2020-08-06 ENCOUNTER — Encounter: Payer: Self-pay | Admitting: Nurse Practitioner

## 2020-08-06 ENCOUNTER — Ambulatory Visit: Payer: 59 | Admitting: Nurse Practitioner

## 2020-08-06 VITALS — BP 136/88 | HR 81 | Temp 97.8°F | Ht 64.88 in | Wt 172.2 lb

## 2020-08-06 DIAGNOSIS — Z7689 Persons encountering health services in other specified circumstances: Secondary | ICD-10-CM | POA: Diagnosis not present

## 2020-08-06 DIAGNOSIS — K219 Gastro-esophageal reflux disease without esophagitis: Secondary | ICD-10-CM | POA: Insufficient documentation

## 2020-08-06 DIAGNOSIS — F419 Anxiety disorder, unspecified: Secondary | ICD-10-CM | POA: Diagnosis not present

## 2020-08-06 DIAGNOSIS — K760 Fatty (change of) liver, not elsewhere classified: Secondary | ICD-10-CM | POA: Diagnosis not present

## 2020-08-06 DIAGNOSIS — F32A Depression, unspecified: Secondary | ICD-10-CM

## 2020-08-06 MED ORDER — SERTRALINE HCL 25 MG PO TABS
25.0000 mg | ORAL_TABLET | Freq: Every day | ORAL | 0 refills | Status: DC
Start: 2020-08-06 — End: 2020-09-08

## 2020-08-06 MED ORDER — OMEPRAZOLE 20 MG PO CPDR
20.0000 mg | DELAYED_RELEASE_CAPSULE | Freq: Every day | ORAL | 0 refills | Status: DC
Start: 2020-08-06 — End: 2020-09-08

## 2020-08-06 NOTE — Patient Instructions (Signed)
https://www.nimh.nih.gov/health/topics/anxiety-disorders/index.shtml">  Panic Attack A panic attack is when you suddenly feel very afraid, uncomfortable, or nervous (anxious). A panic attack can happen when you are scared or for no reason. A panic attack can feel like a serious problem. It can even feel like a heart attack or stroke. See your doctor when you have a panic attack to make sure you do not have a serious problem. Follow these instructions at home:  Take medicines only as told by your doctor.  If you feel worried or nervous, try not to have caffeine.  Take good care of your health. To do this: ? Eat healthy. Make sure to eat fresh fruits and vegetables, whole grains, lean meats, and low-fat dairy. ? Get enough sleep. Try to sleep for 7-8 hours each night. ? Exercise. Try to be active for 30 minutes 5 or more days a week. ? Do not smoke. Talk to your doctor if you need help quitting. ? Limit how much alcohol you drink:  If you are a woman who is not pregnant: try not to have more than 1 drink a day.  If you are a man: try not to have more than 2 drinks a day.  One drink equals 12 oz of beer, 5 oz of wine, or 1 oz of hard liquor.  Keep all follow-up visits as told by your doctor. This is important.   Contact a doctor if:  Your symptoms do not get better.  Your symptoms get worse.  You are not able to take your medicines as told. Get help right away if:  You have thoughts of hurting yourself or others.  You have symptoms of a panic attack. Do not drive yourself to the hospital. Have someone else drive you or call an ambulance. If you feel like you may hurt yourself or others, or have thoughts about taking your own life, get help right away. You can go to your nearest emergency department or call:  Your local emergency services (911 in the [place]  A suicide crisis helpline, such as the National Suicide Prevention Lifeline at 1-800-273-8255. This is open 24 hours a  day. Summary  A panic attack is when you suddenly feel very afraid, uncomfortable, or nervous (anxious).  See your doctor when you have a panic attack to make sure that you do not have another serious problem.  If you feel like you may hurt yourself or others, get help right away by calling 911. This information is not intended to replace advice given to you by your health care provider. Make sure you discuss any questions you have with your health care provider. Document Revised: 08/22/2019 Document Reviewed: 08/22/2019 Elsevier Patient Education  2021 [employer]  

## 2020-08-06 NOTE — Assessment & Plan Note (Addendum)
Chronic.  Uncontrolled. Recommend starting Zoloft 25mg  daily. Side effects and benefits of medication discussed with patient during visit today. Discussed adding Buspar to regimen to help with panic attacks. Patient would like to start with Zoloft and think about adding Buspar in the future. Return to clinic in 1 month for reevaluation.

## 2020-08-06 NOTE — Assessment & Plan Note (Signed)
Chronic.  Ongoing.  Patient has tried pepcid and tums. Recommend using Omeprazole daily for the next 30 days.  Once he has completed the 30 days, recommend patient use medication PRN. Patient understands and agrees with the plan of care.  Follow up in 1 month for reevaluation.

## 2020-08-06 NOTE — Assessment & Plan Note (Addendum)
Chronic.  Stable.  Discussed during visit today.  Recommend decreasing fat in diet. Will draw labs at next visit.

## 2020-09-07 NOTE — Progress Notes (Addendum)
BP 128/81   Pulse 77   Temp 98.2 F (36.8 C)   Ht 5' 4.65" (1.642 m)   Wt 169 lb 2 oz (76.7 kg)   SpO2 98%   BMI 28.45 kg/m    Subjective:    Patient ID: Devin Rush, male    DOB: 11/08/1990, 30 y.o.   MRN: [id number]  HPI: Devin Rush is a 30 y.o. male presenting on 09/08/2020 for comprehensive medical examination. Current medical complaints include:none  He currently lives with: Interim Problems from his last visit: no  GERD GERD control status: better Satisfied with current treatment?  Could be better Heartburn frequency:  Medication side effects: no  Medication compliance: stable Previous GERD medications: Antacid use frequency:   Duration:  Nature:  Location:  Heartburn duration:  Alleviatiating factors:   Aggravating factors:  Dysphagia: no Odynophagia:  no Hematemesis: no Blood in stool: no EGD: no  DEPRESSION/ANXIETY Patient states he feels like his panic attacks are a lot better. He has only had one panic attack since starting on the Zoloft.  Does feel like increasing the dose would be beneficial. Denies SI.   GAD 7 : Generalized Anxiety Score 09/08/2020 08/06/2020  Nervous, Anxious, on Edge 1 2  Control/stop worrying 1 2  Worry too much - different things 1 3  Trouble relaxing 0 2  Restless 0 2  Easily annoyed or irritable 0 2  Afraid - awful might happen 0 2  Total GAD 7 Score 3 15  Anxiety Difficulty Not difficult at all Somewhat difficult       Depression Screen done today and results listed below:  Depression screen [facility] 2/9 09/08/2020 08/06/2020  Decreased Interest 0 1  Down, Depressed, Hopeless 1 1  PHQ - 2 Score 1 2  Altered sleeping 1 2  Tired, decreased energy 1 2  Change in appetite 0 2  Feeling bad or failure about yourself  0 2  Trouble concentrating 0 1  Moving slowly or fidgety/restless 0 2  Suicidal thoughts 0 0  PHQ-9 Score 3 13  Difficult doing work/chores Not difficult at all Somewhat difficult    The  patient does not have a history of falls. I did complete a risk assessment for falls. A plan of care for falls was documented.   Past Medical History:  Past Medical History:  Diagnosis Date   Anxiety     Surgical History:  Past Surgical History:  Procedure Laterality Date   APPENDECTOMY      Medications:  No current outpatient medications on file prior to visit.   No current facility-administered medications on file prior to visit.    Allergies:  No Known Allergies  Social History:  Social History   Socioeconomic History   Marital status: Single    Spouse name: Not on file   Number of children: Not on file   Years of education: Not on file   Highest education level: Not on file  Occupational History   Not on file  Tobacco Use   Smoking status: Never   Smokeless tobacco: Never  Vaping Use   Vaping Use: Never used  Substance and Sexual Activity   Alcohol use: Yes    Comment: on occasion   Drug use: Never   Sexual activity: Yes  Other Topics Concern   Not on file  Social History Narrative   Not on file   Social Determinants of Health   Financial Resource Strain: Not on file  Food Insecurity: Not  on file  Transportation Needs: Not on file  Physical Activity: Not on file  Stress: Not on file  Social Connections: Not on file  Intimate Partner Violence: Not on file   Social History   Tobacco Use  Smoking Status Never  Smokeless Tobacco Never   Social History   Substance and Sexual Activity  Alcohol Use Yes   Comment: on occasion    Family History:  Family History  Problem Relation Age of Onset   Kidney disease Mother    Hypertension Mother    Diabetes Father    Blindness Father    Diabetes Maternal Grandmother    Blindness Paternal Grandmother    Kidney disease Paternal Grandmother    Kidney disease Paternal Grandfather     Past medical history, surgical history, medications, allergies, family history and social history reviewed with  patient today and changes made to appropriate areas of the chart.   Review of Systems  Gastrointestinal:  Positive for heartburn.  Psychiatric/Behavioral:  Positive for depression. Negative for suicidal ideas. The patient is nervous/anxious.   All other ROS negative except what is listed above and in the HPI.      Objective:    BP 128/81   Pulse 77   Temp 98.2 F (36.8 C)   Ht 5' 4.65" (1.642 m)   Wt 169 lb 2 oz (76.7 kg)   SpO2 98%   BMI 28.45 kg/m   Wt Readings from Last 3 Encounters:  09/08/20 169 lb 2 oz (76.7 kg)  08/06/20 172 lb 4 oz (78.1 kg)  09/10/18 154 lb (69.9 kg)    Physical Exam Vitals and nursing note reviewed.  Constitutional:      General: He is not in acute distress.    Appearance: Normal appearance. He is not ill-appearing, toxic-appearing or diaphoretic.  HENT:     Head: Normocephalic.     Right Ear: Tympanic membrane, ear canal and external ear normal.     Left Ear: Tympanic membrane, ear canal and external ear normal.     Nose: Nose normal. No congestion or rhinorrhea.     Mouth/Throat:     Mouth: Mucous membranes are moist.  Eyes:     General:        Right eye: No discharge.        Left eye: No discharge.     Extraocular Movements: Extraocular movements intact.     Conjunctiva/sclera: Conjunctivae normal.     Pupils: Pupils are equal, round, and reactive to light.  Cardiovascular:     Rate and Rhythm: Normal rate and regular rhythm.     Heart sounds: No murmur heard. Pulmonary:     Effort: Pulmonary effort is normal. No respiratory distress.     Breath sounds: Normal breath sounds. No wheezing, rhonchi or rales.  Abdominal:     General: Abdomen is flat. Bowel sounds are normal. There is no distension.     Palpations: Abdomen is soft.     Tenderness: There is no abdominal tenderness. There is no guarding.  Musculoskeletal:     Cervical back: Normal range of motion and neck supple.  Skin:    General: Skin is warm and dry.     Capillary  Refill: Capillary refill takes less than 2 seconds.  Neurological:     General: No focal deficit present.     Mental Status: He is alert and oriented to person, place, and time.     Cranial Nerves: No cranial nerve deficit.  Motor: No weakness.     Deep Tendon Reflexes: Reflexes normal.  Psychiatric:        Mood and Affect: Mood normal.        Behavior: Behavior normal.        Thought Content: Thought content normal.        Judgment: Judgment normal.    Results for orders placed or performed in visit on 09/08/20  TSH  Result Value Ref Range   TSH 1.550 0.450 - 4.500 uIU/mL  Lipid panel  Result Value Ref Range   Cholesterol, Total 226 (H) 100 - 199 mg/dL   Triglycerides 473 (H) 0 - 149 mg/dL   HDL 27 (L) >39 mg/dL   VLDL Cholesterol Cal 83 (H) 5 - 40 mg/dL   LDL Chol Calc (NIH) 116 (H) 0 - 99 mg/dL   Chol/HDL Ratio 8.4 (H) 0.0 - 5.0 ratio  CBC with Differential/Platelet  Result Value Ref Range   WBC 5.3 3.4 - 10.8 x10E3/uL   RBC 5.38 4.14 - 5.80 x10E6/uL   Hemoglobin 16.2 13.0 - 17.7 g/dL   Hematocrit 47.5 37.5 - 51.0 %   MCV 88 79 - 97 fL   MCH 30.1 26.6 - 33.0 pg   MCHC 34.1 31.5 - 35.7 g/dL   RDW 13.1 11.6 - 15.4 %   Platelets 294 150 - 450 x10E3/uL   Neutrophils 51 Not Estab. %   Lymphs 40 Not Estab. %   Monocytes 6 Not Estab. %   Eos 2 Not Estab. %   Basos 1 Not Estab. %   Neutrophils Absolute 2.7 1.4 - 7.0 x10E3/uL   Lymphocytes Absolute 2.1 0.7 - 3.1 x10E3/uL   Monocytes Absolute 0.3 0.1 - 0.9 x10E3/uL   EOS (ABSOLUTE) 0.1 0.0 - 0.4 x10E3/uL   Basophils Absolute 0.0 0.0 - 0.2 x10E3/uL   Immature Granulocytes 0 Not Estab. %   Immature Grans (Abs) 0.0 0.0 - 0.1 x10E3/uL  Comprehensive metabolic panel  Result Value Ref Range   Glucose 97 65 - 99 mg/dL   BUN 12 6 - 20 mg/dL   Creatinine, Ser 0.81 0.76 - 1.27 mg/dL   eGFR 122 >59 mL/min/1.73   BUN/Creatinine Ratio 15 9 - 20   Sodium 136 134 - 144 mmol/L   Potassium 4.2 3.5 - 5.2 mmol/L   Chloride 98 96 -  106 mmol/L   CO2 22 20 - 29 mmol/L   Calcium 9.9 8.7 - 10.2 mg/dL   Total Protein 8.0 6.0 - 8.5 g/dL   Albumin 4.9 4.1 - 5.2 g/dL   Globulin, Total 3.1 1.5 - 4.5 g/dL   Albumin/Globulin Ratio 1.6 1.2 - 2.2   Bilirubin Total 0.4 0.0 - 1.2 mg/dL   Alkaline Phosphatase 109 44 - 121 IU/L   AST 22 0 - 40 IU/L   ALT 54 (H) 0 - 44 IU/L  Urinalysis, Routine w reflex microscopic  Result Value Ref Range   Specific Gravity, UA 1.025 1.005 - 1.030   pH, UA 5.0 5.0 - 7.5   Color, UA Yellow Yellow   Appearance Ur Clear Clear   Leukocytes,UA Negative Negative   Protein,UA Negative Negative/Trace   Glucose, UA Negative Negative   Ketones, UA Negative Negative   RBC, UA Negative Negative   Bilirubin, UA Negative Negative   Urobilinogen, Ur 0.2 0.2 - 1.0 mg/dL   Nitrite, UA Negative Negative  HIV Antibody (routine testing w rflx)  Result Value Ref Range   HIV Screen 4th Generation wRfx Non Reactive  Non Reactive  Hepatitis C Antibody  Result Value Ref Range   Hep C Virus Ab <0.1 0.0 - 0.9 s/co ratio      Assessment & Plan:   Problem List Items Addressed This Visit       Digestive   GERD (gastroesophageal reflux disease)    Chronic.  Improving. Improved from last visit but patient still has some heartburn after every meal.  Will increase Omeprazole to 46m daily. If symptoms persist can switch to Protonix. Follow up in 6 weeks for reevaluation. Call sooner if concerns arise.        Relevant Medications   omeprazole (PRILOSEC) 40 MG capsule     Other   Anxiety and depression    Chronic.  Improving. Patient agrees that increasing the Zoloft to 537mwill continue to improve his symptoms.  Sent to the pharmacy. Follow up in 6 weeks for reevaluation. Call sooner if concerns arise.        Relevant Medications   sertraline (ZOLOFT) 50 MG tablet   Other Visit Diagnoses     Annual physical exam    -  Primary   Health maintenance reviewed during visit today. Labs ordered. TDAP given.     Relevant Orders   TSH (Completed)   Lipid panel (Completed)   CBC with Differential/Platelet (Completed)   Comprehensive metabolic panel (Completed)   Urinalysis, Routine w reflex microscopic (Completed)   Encounter for hepatitis C screening test for low risk patient       Relevant Orders   Hepatitis C Antibody (Completed)   Screening for HIV (human immunodeficiency virus)       Relevant Orders   HIV Antibody (routine testing w rflx) (Completed)   Immunization due       Relevant Orders   Tdap vaccine greater than or equal to 7yo IM (Completed)        Discussed aspirin prophylaxis for myocardial infarction prevention and decision was it was not indicated  LABORATORY TESTING:  Health maintenance labs ordered today as discussed above.   The natural history of prostate cancer and ongoing controversy regarding screening and potential treatment outcomes of prostate cancer has been discussed with the patient. The meaning of a false positive PSA and a false negative PSA has been discussed. He indicates understanding of the limitations of this screening test and wishes not to proceed with screening PSA testing.   IMMUNIZATIONS:   - Tdap: Tetanus vaccination status reviewed: last tetanus booster within 10 years. - Influenza: Postponed to flu season - Pneumovax: Not applicable - Prevnar: Not applicable - HPV:  would like to wait until next visit - Zostavax vaccine: Not applicable  SCREENING: - Colonoscopy: Not applicable  Discussed with patient purpose of the colonoscopy is to detect colon cancer at curable precancerous or early stages   - AAA Screening: Not applicable  -Hearing Test: Not applicable  -Spirometry: Not applicable   PATIENT COUNSELING:    Sexuality: Discussed sexually transmitted diseases, partner selection, use of condoms, avoidance of unintended pregnancy  and contraceptive alternatives.   Advised to avoid cigarette smoking.  I discussed with the patient that  most people either abstain from alcohol or drink within safe limits (<=14/week and <=4 drinks/occasion for males, <=7/weeks and <= 3 drinks/occasion for females) and that the risk for alcohol disorders and other health effects rises proportionally with the number of drinks per week and how often a drinker exceeds daily limits.  Discussed cessation/primary prevention of drug use and availability of treatment for  abuse.   Diet: Encouraged to adjust caloric intake to maintain  or achieve ideal body weight, to reduce intake of dietary saturated fat and total fat, to limit sodium intake by avoiding high sodium foods and not adding table salt, and to maintain adequate dietary potassium and calcium preferably from fresh fruits, vegetables, and low-fat dairy products.    stressed the importance of regular exercise  Injury prevention: Discussed safety belts, safety helmets, smoke detector, smoking near bedding or upholstery.   Dental health: Discussed importance of regular tooth brushing, flossing, and dental visits.   Follow up plan: NEXT PREVENTATIVE PHYSICAL DUE IN 1 YEAR. Return in about 6 weeks (around 10/20/2020) for Depression/Anxiety FU, GERD.

## 2020-09-08 ENCOUNTER — Encounter: Payer: Self-pay | Admitting: Nurse Practitioner

## 2020-09-08 ENCOUNTER — Ambulatory Visit (INDEPENDENT_AMBULATORY_CARE_PROVIDER_SITE_OTHER): Payer: 59 | Admitting: Nurse Practitioner

## 2020-09-08 ENCOUNTER — Other Ambulatory Visit: Payer: Self-pay

## 2020-09-08 VITALS — BP 128/81 | HR 77 | Temp 98.2°F | Ht 64.65 in | Wt 169.1 lb

## 2020-09-08 DIAGNOSIS — Z114 Encounter for screening for human immunodeficiency virus [HIV]: Secondary | ICD-10-CM

## 2020-09-08 DIAGNOSIS — Z0000 Encounter for general adult medical examination without abnormal findings: Secondary | ICD-10-CM | POA: Diagnosis not present

## 2020-09-08 DIAGNOSIS — Z23 Encounter for immunization: Secondary | ICD-10-CM | POA: Diagnosis not present

## 2020-09-08 DIAGNOSIS — F32A Depression, unspecified: Secondary | ICD-10-CM | POA: Diagnosis not present

## 2020-09-08 DIAGNOSIS — K219 Gastro-esophageal reflux disease without esophagitis: Secondary | ICD-10-CM

## 2020-09-08 DIAGNOSIS — F419 Anxiety disorder, unspecified: Secondary | ICD-10-CM

## 2020-09-08 DIAGNOSIS — Z1159 Encounter for screening for other viral diseases: Secondary | ICD-10-CM

## 2020-09-08 LAB — URINALYSIS, ROUTINE W REFLEX MICROSCOPIC
Bilirubin, UA: NEGATIVE
Glucose, UA: NEGATIVE
Ketones, UA: NEGATIVE
Leukocytes,UA: NEGATIVE
Nitrite, UA: NEGATIVE
Protein,UA: NEGATIVE
RBC, UA: NEGATIVE
Specific Gravity, UA: 1.025 (ref 1.005–1.030)
Urobilinogen, Ur: 0.2 mg/dL (ref 0.2–1.0)
pH, UA: 5 (ref 5.0–7.5)

## 2020-09-08 MED ORDER — OMEPRAZOLE 40 MG PO CPDR: 40.0000 mg | DELAYED_RELEASE_CAPSULE | Freq: Every day | ORAL | 1 refills | Status: DC

## 2020-09-08 MED ORDER — SERTRALINE HCL 50 MG PO TABS: 50.0000 mg | ORAL_TABLET | Freq: Every day | ORAL | 1 refills | Status: DC

## 2020-09-08 NOTE — Assessment & Plan Note (Signed)
Chronic.  Improving. Patient agrees that increasing the Zoloft to 50mg  will continue to improve his symptoms.  Sent to the pharmacy. Follow up in 6 weeks for reevaluation. Call sooner if concerns arise.

## 2020-09-08 NOTE — Assessment & Plan Note (Signed)
Chronic.  Improving. Improved from last visit but patient still has some heartburn after every meal.  Will increase Omeprazole to 40mg  daily. If symptoms persist can switch to Protonix. Follow up in 6 weeks for reevaluation. Call sooner if concerns arise.

## 2020-09-09 LAB — COMPREHENSIVE METABOLIC PANEL
ALT: 54 IU/L — ABNORMAL HIGH (ref 0–44)
AST: 22 IU/L (ref 0–40)
Albumin/Globulin Ratio: 1.6 (ref 1.2–2.2)
Albumin: 4.9 g/dL (ref 4.1–5.2)
Alkaline Phosphatase: 109 IU/L (ref 44–121)
BUN/Creatinine Ratio: 15 (ref 9–20)
BUN: 12 mg/dL (ref 6–20)
Bilirubin Total: 0.4 mg/dL (ref 0.0–1.2)
CO2: 22 mmol/L (ref 20–29)
Calcium: 9.9 mg/dL (ref 8.7–10.2)
Chloride: 98 mmol/L (ref 96–106)
Creatinine, Ser: 0.81 mg/dL (ref 0.76–1.27)
Globulin, Total: 3.1 g/dL (ref 1.5–4.5)
Glucose: 97 mg/dL (ref 65–99)
Potassium: 4.2 mmol/L (ref 3.5–5.2)
Sodium: 136 mmol/L (ref 134–144)
Total Protein: 8 g/dL (ref 6.0–8.5)
eGFR: 122 mL/min/{1.73_m2} (ref >59)

## 2020-09-09 LAB — CBC WITH DIFFERENTIAL/PLATELET
Basophils Absolute: 0 10*3/uL (ref 0.0–0.2)
Basos: 1 %
EOS (ABSOLUTE): 0.1 10*3/uL (ref 0.0–0.4)
Eos: 2 %
Hematocrit: 47.5 % (ref 37.5–51.0)
Hemoglobin: 16.2 g/dL (ref 13.0–17.7)
Immature Grans (Abs): 0 10*3/uL (ref 0.0–0.1)
Immature Granulocytes: 0 %
Lymphocytes Absolute: 2.1 10*3/uL (ref 0.7–3.1)
Lymphs: 40 %
MCH: 30.1 pg (ref 26.6–33.0)
MCHC: 34.1 g/dL (ref 31.5–35.7)
MCV: 88 fL (ref 79–97)
Monocytes Absolute: 0.3 10*3/uL (ref 0.1–0.9)
Monocytes: 6 %
Neutrophils Absolute: 2.7 10*3/uL (ref 1.4–7.0)
Neutrophils: 51 %
Platelets: 294 10*3/uL (ref 150–450)
RBC: 5.38 x10E6/uL (ref 4.14–5.80)
RDW: 13.1 % (ref 11.6–15.4)
WBC: 5.3 10*3/uL (ref 3.4–10.8)

## 2020-09-09 LAB — LIPID PANEL
Chol/HDL Ratio: 8.4 ratio — ABNORMAL HIGH (ref 0.0–5.0)
Cholesterol, Total: 226 mg/dL — ABNORMAL HIGH (ref 100–199)
HDL: 27 mg/dL — ABNORMAL LOW (ref >39)
LDL Chol Calc (NIH): 116 mg/dL — ABNORMAL HIGH (ref 0–99)
Triglycerides: 473 mg/dL — ABNORMAL HIGH (ref 0–149)
VLDL Cholesterol Cal: 83 mg/dL — ABNORMAL HIGH (ref 5–40)

## 2020-09-09 LAB — HIV ANTIBODY (ROUTINE TESTING W REFLEX): HIV Screen 4th Generation wRfx: NONREACTIVE

## 2020-09-09 LAB — TSH: TSH: 1.55 u[IU]/mL (ref 0.450–4.500)

## 2020-09-09 LAB — HEPATITIS C ANTIBODY: Hep C Virus Ab: <0.1 s/co ratio (ref 0.0–0.9)

## 2020-09-09 NOTE — Progress Notes (Signed)
Please let patient know that overall his lab work looks good.  However, his cholesterol is significantly elevated.  I recommend he follow a low fat diet and exercise 5x weekly for at least 30 mins.  We should recheck this in 6 months, if it is still elevated we should discus medication.  Please let me know if patient has any questions.

## 2020-10-26 ENCOUNTER — Ambulatory Visit: Payer: 59 | Admitting: Nurse Practitioner

## 2020-10-26 NOTE — Progress Notes (Deleted)
There were no vitals taken for this visit.   Subjective:    Patient ID: Devin Rush, male    DOB: 11-Mar-1990, 30 y.o.   MRN: [id number]  HPI: Devin Rush is a 30 y.o. male  No chief complaint on file.  GERD GERD control status: {Blank single:19197::"controlled","uncontrolled","better","worse","exacerbated","stable"}Satisfied with current treatment? {Blank single:19197::"yes","no"} Heartburn frequency:  Medication side effects: {Blank single:19197::"yes","no"}  Medication compliance: {Blank multiple:19196::"better","worse","stable","fluctuating"} Previous GERD medications: Antacid use frequency:   Duration:  Nature:  Location:  Heartburn duration:  Alleviatiating factors:   Aggravating factors:  Dysphagia: {Blank single:19197::"yes","no"} Odynophagia:  {Blank single:19197::"yes","no"} Hematemesis: {Blank single:19197::"yes","no"} Blood in stool: {Blank single:19197::"yes","no"} EGD: {Blank single:19197::"yes","no"}  ANXIETY/DEPRESSION   Relevant past medical, surgical, family and social history reviewed and updated as indicated. Interim medical history since our last visit reviewed. Allergies and medications reviewed and updated.  Review of Systems  Per HPI unless specifically indicated above     Objective:    There were no vitals taken for this visit.  Wt Readings from Last 3 Encounters:  09/08/20 169 lb 2 oz (76.7 kg)  08/06/20 172 lb 4 oz (78.1 kg)  09/10/18 154 lb (69.9 kg)    Physical Exam  Results for orders placed or performed in visit on 09/08/20  TSH  Result Value Ref Range   TSH 1.550 0.450 - 4.500 uIU/mL  Lipid panel  Result Value Ref Range   Cholesterol, Total 226 (H) 100 - 199 mg/dL   Triglycerides 473 (H) 0 - 149 mg/dL   HDL 27 (L) >39 mg/dL   VLDL Cholesterol Cal 83 (H) 5 - 40 mg/dL   LDL Chol Calc (NIH) 116 (H) 0 - 99 mg/dL   Chol/HDL Ratio 8.4 (H) 0.0 - 5.0 ratio  CBC with Differential/Platelet  Result Value Ref Range    WBC 5.3 3.4 - 10.8 x10E3/uL   RBC 5.38 4.14 - 5.80 x10E6/uL   Hemoglobin 16.2 13.0 - 17.7 g/dL   Hematocrit 47.5 37.5 - 51.0 %   MCV 88 79 - 97 fL   MCH 30.1 26.6 - 33.0 pg   MCHC 34.1 31.5 - 35.7 g/dL   RDW 13.1 11.6 - 15.4 %   Platelets 294 150 - 450 x10E3/uL   Neutrophils 51 Not Estab. %   Lymphs 40 Not Estab. %   Monocytes 6 Not Estab. %   Eos 2 Not Estab. %   Basos 1 Not Estab. %   Neutrophils Absolute 2.7 1.4 - 7.0 x10E3/uL   Lymphocytes Absolute 2.1 0.7 - 3.1 x10E3/uL   Monocytes Absolute 0.3 0.1 - 0.9 x10E3/uL   EOS (ABSOLUTE) 0.1 0.0 - 0.4 x10E3/uL   Basophils Absolute 0.0 0.0 - 0.2 x10E3/uL   Immature Granulocytes 0 Not Estab. %   Immature Grans (Abs) 0.0 0.0 - 0.1 x10E3/uL  Comprehensive metabolic panel  Result Value Ref Range   Glucose 97 65 - 99 mg/dL   BUN 12 6 - 20 mg/dL   Creatinine, Ser 0.81 0.76 - 1.27 mg/dL   eGFR 122 >59 mL/min/1.73   BUN/Creatinine Ratio 15 9 - 20   Sodium 136 134 - 144 mmol/L   Potassium 4.2 3.5 - 5.2 mmol/L   Chloride 98 96 - 106 mmol/L   CO2 22 20 - 29 mmol/L   Calcium 9.9 8.7 - 10.2 mg/dL   Total Protein 8.0 6.0 - 8.5 g/dL   Albumin 4.9 4.1 - 5.2 g/dL   Globulin, Total 3.1 1.5 - 4.5 g/dL   Albumin/Globulin Ratio 1.6 1.2 -  2.2   Bilirubin Total 0.4 0.0 - 1.2 mg/dL   Alkaline Phosphatase 109 44 - 121 IU/L   AST 22 0 - 40 IU/L   ALT 54 (H) 0 - 44 IU/L  Urinalysis, Routine w reflex microscopic  Result Value Ref Range   Specific Gravity, UA 1.025 1.005 - 1.030   pH, UA 5.0 5.0 - 7.5   Color, UA Yellow Yellow   Appearance Ur Clear Clear   Leukocytes,UA Negative Negative   Protein,UA Negative Negative/Trace   Glucose, UA Negative Negative   Ketones, UA Negative Negative   RBC, UA Negative Negative   Bilirubin, UA Negative Negative   Urobilinogen, Ur 0.2 0.2 - 1.0 mg/dL   Nitrite, UA Negative Negative  HIV Antibody (routine testing w rflx)  Result Value Ref Range   HIV Screen 4th Generation wRfx Non Reactive Non Reactive   Hepatitis C Antibody  Result Value Ref Range   Hep C Virus Ab <0.1 0.0 - 0.9 s/co ratio      Assessment & Plan:   Problem List Items Addressed This Visit       Digestive   GERD (gastroesophageal reflux disease)     Other   Anxiety and depression - Primary     Follow up plan: No follow-ups on file.

## 2020-11-02 ENCOUNTER — Ambulatory Visit: Payer: 59 | Admitting: Nurse Practitioner

## 2020-11-02 ENCOUNTER — Encounter: Payer: Self-pay | Admitting: Nurse Practitioner

## 2020-11-02 ENCOUNTER — Other Ambulatory Visit: Payer: Self-pay

## 2020-11-02 VITALS — BP 132/85 | HR 73 | Temp 98.5°F | Ht 65.0 in | Wt 170.2 lb

## 2020-11-02 DIAGNOSIS — K219 Gastro-esophageal reflux disease without esophagitis: Secondary | ICD-10-CM

## 2020-11-02 DIAGNOSIS — F419 Anxiety disorder, unspecified: Secondary | ICD-10-CM | POA: Diagnosis not present

## 2020-11-02 DIAGNOSIS — F32A Depression, unspecified: Secondary | ICD-10-CM

## 2020-11-02 MED ORDER — SERTRALINE HCL 50 MG PO TABS: 50.0000 mg | ORAL_TABLET | Freq: Every day | ORAL | 1 refills | Status: DC

## 2020-11-02 MED ORDER — OMEPRAZOLE 40 MG PO CPDR: 40.0000 mg | DELAYED_RELEASE_CAPSULE | Freq: Every day | ORAL | 1 refills | Status: DC

## 2020-11-02 NOTE — Assessment & Plan Note (Signed)
Chronic.  Controlled.  Continue with current medication regimen of Omeprazole 40mg  daily.  Refill of medication sent. Return to clinic in 3 months for reevaluation.  Call sooner if concerns arise.

## 2020-11-02 NOTE — Progress Notes (Signed)
BP 132/85   Pulse 73   Temp 98.5 F (36.9 C)   Ht 5' 5"  (1.651 m)   Wt 170 lb 4 oz (77.2 kg)   SpO2 98%   BMI 28.33 kg/m    Subjective:    Patient ID: Devin Rush, male    DOB: 03-10-1990, 30 y.o.   MRN: [id number]  HPI: Devin Rush is a 30 y.o. male  Chief Complaint  Patient presents with   Anxiety   Gastroesophageal Reflux   ANXIETY/DEPRESSION Patient states his anxiety is a lot better. The Zoloft is helping a lot.  Patient states he feels like this is a good dose for him.    [facility] Visit from 11/02/2020 in [facility]  PHQ-9 Total Score 3      GAD 7 : Generalized Anxiety Score 11/02/2020 09/08/2020 08/06/2020  Nervous, Anxious, on Edge 0 1 2  Control/stop worrying 0 1 2  Worry too much - different things 0 1 3  Trouble relaxing 0 0 2  Restless 0 0 2  Easily annoyed or irritable 0 0 2  Afraid - awful might happen 0 0 2  Total GAD 7 Score 0 3 15  Anxiety Difficulty Not difficult at all Not difficult at all Somewhat difficult   GERD Patient states his GERD is a lot better than it was before.  States his medication is helping and he has changed his eating habits and starting to lose some weight.    Relevant past medical, surgical, family and social history reviewed and updated as indicated. Interim medical history since our last visit reviewed. Allergies and medications reviewed and updated.  Review of Systems  Eyes:  Negative for visual disturbance.  Respiratory:  Negative for chest tightness and shortness of breath.   Cardiovascular:  Negative for chest pain, palpitations and leg swelling.  Gastrointestinal:        Heart burn  Neurological:  Negative for dizziness, light-headedness and headaches.  Psychiatric/Behavioral:  Positive for dysphoric mood. Negative for suicidal ideas. The patient is not nervous/anxious.    Per HPI unless specifically indicated above     Objective:    BP 132/85   Pulse 73   Temp 98.5  F (36.9 C)   Ht 5' 5"  (1.651 m)   Wt 170 lb 4 oz (77.2 kg)   SpO2 98%   BMI 28.33 kg/m   Wt Readings from Last 3 Encounters:  11/02/20 170 lb 4 oz (77.2 kg)  09/08/20 169 lb 2 oz (76.7 kg)  08/06/20 172 lb 4 oz (78.1 kg)    Physical Exam Vitals and nursing note reviewed.  Constitutional:      General: He is not in acute distress.    Appearance: Normal appearance. He is not ill-appearing, toxic-appearing or diaphoretic.  HENT:     Head: Normocephalic.     Right Ear: External ear normal.     Left Ear: External ear normal.     Nose: Nose normal. No congestion or rhinorrhea.     Mouth/Throat:     Mouth: Mucous membranes are moist.  Eyes:     General:        Right eye: No discharge.        Left eye: No discharge.     Extraocular Movements: Extraocular movements intact.     Conjunctiva/sclera: Conjunctivae normal.     Pupils: Pupils are equal, round, and reactive to light.  Cardiovascular:     Rate and Rhythm: Normal  rate and regular rhythm.     Heart sounds: No murmur heard. Pulmonary:     Effort: Pulmonary effort is normal. No respiratory distress.     Breath sounds: Normal breath sounds. No wheezing, rhonchi or rales.  Abdominal:     General: Abdomen is flat. Bowel sounds are normal.  Musculoskeletal:     Cervical back: Normal range of motion and neck supple.  Skin:    General: Skin is warm and dry.     Capillary Refill: Capillary refill takes less than 2 seconds.  Neurological:     General: No focal deficit present.     Mental Status: He is alert and oriented to person, place, and time.  Psychiatric:        Mood and Affect: Mood normal.        Behavior: Behavior normal.        Thought Content: Thought content normal.        Judgment: Judgment normal.    Results for orders placed or performed in visit on 09/08/20  TSH  Result Value Ref Range   TSH 1.550 0.450 - 4.500 uIU/mL  Lipid panel  Result Value Ref Range   Cholesterol, Total 226 (H) 100 - 199 mg/dL    Triglycerides 473 (H) 0 - 149 mg/dL   HDL 27 (L) >39 mg/dL   VLDL Cholesterol Cal 83 (H) 5 - 40 mg/dL   LDL Chol Calc (NIH) 116 (H) 0 - 99 mg/dL   Chol/HDL Ratio 8.4 (H) 0.0 - 5.0 ratio  CBC with Differential/Platelet  Result Value Ref Range   WBC 5.3 3.4 - 10.8 x10E3/uL   RBC 5.38 4.14 - 5.80 x10E6/uL   Hemoglobin 16.2 13.0 - 17.7 g/dL   Hematocrit 47.5 37.5 - 51.0 %   MCV 88 79 - 97 fL   MCH 30.1 26.6 - 33.0 pg   MCHC 34.1 31.5 - 35.7 g/dL   RDW 13.1 11.6 - 15.4 %   Platelets 294 150 - 450 x10E3/uL   Neutrophils 51 Not Estab. %   Lymphs 40 Not Estab. %   Monocytes 6 Not Estab. %   Eos 2 Not Estab. %   Basos 1 Not Estab. %   Neutrophils Absolute 2.7 1.4 - 7.0 x10E3/uL   Lymphocytes Absolute 2.1 0.7 - 3.1 x10E3/uL   Monocytes Absolute 0.3 0.1 - 0.9 x10E3/uL   EOS (ABSOLUTE) 0.1 0.0 - 0.4 x10E3/uL   Basophils Absolute 0.0 0.0 - 0.2 x10E3/uL   Immature Granulocytes 0 Not Estab. %   Immature Grans (Abs) 0.0 0.0 - 0.1 x10E3/uL  Comprehensive metabolic panel  Result Value Ref Range   Glucose 97 65 - 99 mg/dL   BUN 12 6 - 20 mg/dL   Creatinine, Ser 0.81 0.76 - 1.27 mg/dL   eGFR 122 >59 mL/min/1.73   BUN/Creatinine Ratio 15 9 - 20   Sodium 136 134 - 144 mmol/L   Potassium 4.2 3.5 - 5.2 mmol/L   Chloride 98 96 - 106 mmol/L   CO2 22 20 - 29 mmol/L   Calcium 9.9 8.7 - 10.2 mg/dL   Total Protein 8.0 6.0 - 8.5 g/dL   Albumin 4.9 4.1 - 5.2 g/dL   Globulin, Total 3.1 1.5 - 4.5 g/dL   Albumin/Globulin Ratio 1.6 1.2 - 2.2   Bilirubin Total 0.4 0.0 - 1.2 mg/dL   Alkaline Phosphatase 109 44 - 121 IU/L   AST 22 0 - 40 IU/L   ALT 54 (H) 0 - 44 IU/L  Urinalysis,  Routine w reflex microscopic  Result Value Ref Range   Specific Gravity, UA 1.025 1.005 - 1.030   pH, UA 5.0 5.0 - 7.5   Color, UA Yellow Yellow   Appearance Ur Clear Clear   Leukocytes,UA Negative Negative   Protein,UA Negative Negative/Trace   Glucose, UA Negative Negative   Ketones, UA Negative Negative   RBC, UA  Negative Negative   Bilirubin, UA Negative Negative   Urobilinogen, Ur 0.2 0.2 - 1.0 mg/dL   Nitrite, UA Negative Negative  HIV Antibody (routine testing w rflx)  Result Value Ref Range   HIV Screen 4th Generation wRfx Non Reactive Non Reactive  Hepatitis C Antibody  Result Value Ref Range   Hep C Virus Ab <0.1 0.0 - 0.9 s/co ratio      Assessment & Plan:   Problem List Items Addressed This Visit       Digestive   GERD (gastroesophageal reflux disease)    Chronic.  Controlled.  Continue with current medication regimen of Omeprazole 52m daily.  Refill of medication sent. Return to clinic in 3 months for reevaluation.  Call sooner if concerns arise.        Relevant Medications   omeprazole (PRILOSEC) 40 MG capsule     Other   Anxiety and depression - Primary    Chronic.  Controlled.  Continue with current medication regimen.  Patient feels like this is a good dose for him.  Refills sent to the pharmacy. Return to clinic in 3 months for reevaluation.  Call sooner if concerns arise.        Relevant Medications   sertraline (ZOLOFT) 50 MG tablet     Follow up plan: Return in about 3 months (around 02/02/2021) for Depression/Anxiety FU, HLD, GERD.

## 2020-11-02 NOTE — Assessment & Plan Note (Signed)
Chronic.  Controlled.  Continue with current medication regimen.  Patient feels like this is a good dose for him.  Refills sent to the pharmacy. Return to clinic in 3 months for reevaluation.  Call sooner if concerns arise.

## 2021-02-02 ENCOUNTER — Ambulatory Visit (INDEPENDENT_AMBULATORY_CARE_PROVIDER_SITE_OTHER): Payer: 59 | Admitting: Nurse Practitioner

## 2021-02-02 ENCOUNTER — Encounter: Payer: Self-pay | Admitting: Nurse Practitioner

## 2021-02-02 ENCOUNTER — Other Ambulatory Visit: Payer: Self-pay

## 2021-02-02 VITALS — BP 132/78 | HR 68 | Temp 98.5°F | Wt 173.0 lb

## 2021-02-02 DIAGNOSIS — F32A Depression, unspecified: Secondary | ICD-10-CM | POA: Diagnosis not present

## 2021-02-02 DIAGNOSIS — E782 Mixed hyperlipidemia: Secondary | ICD-10-CM | POA: Diagnosis not present

## 2021-02-02 DIAGNOSIS — K219 Gastro-esophageal reflux disease without esophagitis: Secondary | ICD-10-CM

## 2021-02-02 DIAGNOSIS — F419 Anxiety disorder, unspecified: Secondary | ICD-10-CM | POA: Diagnosis not present

## 2021-02-02 MED ORDER — SERTRALINE HCL 50 MG PO TABS: 50.0000 mg | ORAL_TABLET | Freq: Every day | ORAL | 1 refills | Status: DC

## 2021-02-02 MED ORDER — OMEPRAZOLE 40 MG PO CPDR: 40.0000 mg | DELAYED_RELEASE_CAPSULE | Freq: Every day | ORAL | 1 refills | Status: DC

## 2021-02-02 NOTE — Assessment & Plan Note (Signed)
Chronic.  Controlled.  Continue with current medication regimen on Zoloft 50mg  daily.  Refill sent today. Return to clinic in 6 months for reevaluation.  Call sooner if concerns arise.

## 2021-02-02 NOTE — Assessment & Plan Note (Signed)
Chronic. Not well controlled.  Reviewed lab results with patient from previous visit.  Advised patient on diet modifications to help with high cholesterol.  Follow up in 6 months for repeat labs.

## 2021-02-02 NOTE — Progress Notes (Signed)
BP 132/78   Pulse 68   Temp 98.5 F (36.9 C) (Oral)   Wt 173 lb (78.5 kg)   SpO2 98%   BMI 28.79 kg/m    Subjective:    Patient ID: Devin Rush, male    DOB: [birth year], 30 y.o.   MRN: [id number]  HPI: Devin Rush is a 30 y.o. male  Chief Complaint  Patient presents with   Heartburn   Anxiety    FU.   ANXIETY/DEPRESSION Patient states his anxiety is a lot better. The Zoloft is helping a lot.  Patient states he feels like this is a good dose for him.  He feels like it is still working well for him.   [facility] Visit from 02/02/2021 in [facility]  PHQ-9 Total Score 3      GAD 7 : Generalized Anxiety Score 02/02/2021 11/02/2020 09/08/2020 08/06/2020  Nervous, Anxious, on Edge 1 0 1 2  Control/stop worrying 1 0 1 2  Worry too much - different things 1 0 1 3  Trouble relaxing 0 0 0 2  Restless 0 0 0 2  Easily annoyed or irritable 0 0 0 2  Afraid - awful might happen 0 0 0 2  Total GAD 7 Score 3 0 3 15  Anxiety Difficulty Not difficult at all Not difficult at all Not difficult at all Somewhat difficult   GERD Patient states the omeprazole is still helping his symptoms.  States he has had some occassions where the reflux is bothering him a lot.  Patient states it is not as bad as it was before. He feels like he is not able to burp and then he takes the alca seltzer which helps his symptoms.  HYPERLIPIDEMIA Patient hasn't really changed his diet.    Relevant past medical, surgical, family and social history reviewed and updated as indicated. Interim medical history since our last visit reviewed. Allergies and medications reviewed and updated.  Review of Systems  Eyes:  Negative for visual disturbance.  Respiratory:  Negative for chest tightness and shortness of breath.   Cardiovascular:  Negative for chest pain, palpitations and leg swelling.  Gastrointestinal:        Heart burn  Neurological:  Negative for dizziness,  light-headedness and headaches.  Psychiatric/Behavioral:  Positive for dysphoric mood. Negative for suicidal ideas. The patient is not nervous/anxious.    Per HPI unless specifically indicated above     Objective:    BP 132/78   Pulse 68   Temp 98.5 F (36.9 C) (Oral)   Wt 173 lb (78.5 kg)   SpO2 98%   BMI 28.79 kg/m   Wt Readings from Last 3 Encounters:  02/02/21 173 lb (78.5 kg)  11/02/20 170 lb 4 oz (77.2 kg)  09/08/20 169 lb 2 oz (76.7 kg)    Physical Exam Vitals and nursing note reviewed.  Constitutional:      General: He is not in acute distress.    Appearance: Normal appearance. He is not ill-appearing, toxic-appearing or diaphoretic.  HENT:     Head: Normocephalic.     Right Ear: External ear normal.     Left Ear: External ear normal.     Nose: Nose normal. No congestion or rhinorrhea.     Mouth/Throat:     Mouth: Mucous membranes are moist.  Eyes:     General:        Right eye: No discharge.        Left eye:  No discharge.     Extraocular Movements: Extraocular movements intact.     Conjunctiva/sclera: Conjunctivae normal.     Pupils: Pupils are equal, round, and reactive to light.  Cardiovascular:     Rate and Rhythm: Normal rate and regular rhythm.     Heart sounds: No murmur heard. Pulmonary:     Effort: Pulmonary effort is normal. No respiratory distress.     Breath sounds: Normal breath sounds. No wheezing, rhonchi or rales.  Abdominal:     General: Abdomen is flat. Bowel sounds are normal.  Musculoskeletal:     Cervical back: Normal range of motion and neck supple.  Skin:    General: Skin is warm and dry.     Capillary Refill: Capillary refill takes less than 2 seconds.  Neurological:     General: No focal deficit present.     Mental Status: He is alert and oriented to person, place, and time.  Psychiatric:        Mood and Affect: Mood normal.        Behavior: Behavior normal.        Thought Content: Thought content normal.        Judgment:  Judgment normal.    Results for orders placed or performed in visit on 09/08/20  TSH  Result Value Ref Range   TSH 1.550 0.450 - 4.500 uIU/mL  Lipid panel  Result Value Ref Range   Cholesterol, Total 226 (H) 100 - 199 mg/dL   Triglycerides 473 (H) 0 - 149 mg/dL   HDL 27 (L) >39 mg/dL   VLDL Cholesterol Cal 83 (H) 5 - 40 mg/dL   LDL Chol Calc (NIH) 116 (H) 0 - 99 mg/dL   Chol/HDL Ratio 8.4 (H) 0.0 - 5.0 ratio  CBC with Differential/Platelet  Result Value Ref Range   WBC 5.3 3.4 - 10.8 x10E3/uL   RBC 5.38 4.14 - 5.80 x10E6/uL   Hemoglobin 16.2 13.0 - 17.7 g/dL   Hematocrit 47.5 37.5 - 51.0 %   MCV 88 79 - 97 fL   MCH 30.1 26.6 - 33.0 pg   MCHC 34.1 31.5 - 35.7 g/dL   RDW 13.1 11.6 - 15.4 %   Platelets 294 150 - 450 x10E3/uL   Neutrophils 51 Not Estab. %   Lymphs 40 Not Estab. %   Monocytes 6 Not Estab. %   Eos 2 Not Estab. %   Basos 1 Not Estab. %   Neutrophils Absolute 2.7 1.4 - 7.0 x10E3/uL   Lymphocytes Absolute 2.1 0.7 - 3.1 x10E3/uL   Monocytes Absolute 0.3 0.1 - 0.9 x10E3/uL   EOS (ABSOLUTE) 0.1 0.0 - 0.4 x10E3/uL   Basophils Absolute 0.0 0.0 - 0.2 x10E3/uL   Immature Granulocytes 0 Not Estab. %   Immature Grans (Abs) 0.0 0.0 - 0.1 x10E3/uL  Comprehensive metabolic panel  Result Value Ref Range   Glucose 97 65 - 99 mg/dL   BUN 12 6 - 20 mg/dL   Creatinine, Ser 0.81 0.76 - 1.27 mg/dL   eGFR 122 >59 mL/min/1.73   BUN/Creatinine Ratio 15 9 - 20   Sodium 136 134 - 144 mmol/L   Potassium 4.2 3.5 - 5.2 mmol/L   Chloride 98 96 - 106 mmol/L   CO2 22 20 - 29 mmol/L   Calcium 9.9 8.7 - 10.2 mg/dL   Total Protein 8.0 6.0 - 8.5 g/dL   Albumin 4.9 4.1 - 5.2 g/dL   Globulin, Total 3.1 1.5 - 4.5 g/dL   Albumin/Globulin Ratio  1.6 1.2 - 2.2   Bilirubin Total 0.4 0.0 - 1.2 mg/dL   Alkaline Phosphatase 109 44 - 121 IU/L   AST 22 0 - 40 IU/L   ALT 54 (H) 0 - 44 IU/L  Urinalysis, Routine w reflex microscopic  Result Value Ref Range   Specific Gravity, UA 1.025 1.005 - 1.030    pH, UA 5.0 5.0 - 7.5   Color, UA Yellow Yellow   Appearance Ur Clear Clear   Leukocytes,UA Negative Negative   Protein,UA Negative Negative/Trace   Glucose, UA Negative Negative   Ketones, UA Negative Negative   RBC, UA Negative Negative   Bilirubin, UA Negative Negative   Urobilinogen, Ur 0.2 0.2 - 1.0 mg/dL   Nitrite, UA Negative Negative  HIV Antibody (routine testing w rflx)  Result Value Ref Range   HIV Screen 4th Generation wRfx Non Reactive Non Reactive  Hepatitis C Antibody  Result Value Ref Range   Hep C Virus Ab <0.1 0.0 - 0.9 s/co ratio      Assessment & Plan:   Problem List Items Addressed This Visit       Digestive   GERD (gastroesophageal reflux disease)    Chronic.  Controlled.  Continue with current medication regimen on Omeprazole 21m daily.  Refill sent today.  Return to clinic in 6 months for reevaluation.  Call sooner if concerns arise.        Relevant Medications   omeprazole (PRILOSEC) 40 MG capsule     Other   Anxiety and depression - Primary    Chronic.  Controlled.  Continue with current medication regimen on Zoloft 515mdaily.  Refill sent today. Return to clinic in 6 months for reevaluation.  Call sooner if concerns arise.        Relevant Medications   sertraline (ZOLOFT) 50 MG tablet   Mixed hyperlipidemia    Chronic. Not well controlled.  Reviewed lab results with patient from previous visit.  Advised patient on diet modifications to help with high cholesterol.  Follow up in 6 months for repeat labs.        Follow up plan: Return in about 6 months (around 08/02/2021) for HTN, HLD, DM2 FU.

## 2021-02-02 NOTE — Assessment & Plan Note (Addendum)
Chronic.  Controlled.  Continue with current medication regimen on Omeprazole 40mg  daily.  Refill sent today.  Return to clinic in 6 months for reevaluation.  Call sooner if concerns arise.

## 2021-04-08 ENCOUNTER — Encounter: Payer: Self-pay | Admitting: Nurse Practitioner

## 2021-07-12 ENCOUNTER — Other Ambulatory Visit: Payer: Self-pay

## 2021-07-12 ENCOUNTER — Emergency Department
Admission: EM | Admit: 2021-07-12 | Discharge: 2021-07-12 | Disposition: A | Discharge status: Home / Self Care | Payer: Self-pay | Attending: Emergency Medicine | Admitting: Emergency Medicine

## 2021-07-12 DIAGNOSIS — R197 Diarrhea, unspecified: Secondary | ICD-10-CM | POA: Insufficient documentation

## 2021-07-12 DIAGNOSIS — R1084 Generalized abdominal pain: Secondary | ICD-10-CM | POA: Insufficient documentation

## 2021-07-12 LAB — CBC
HCT: 46.2 % (ref 39.0–52.0)
Hemoglobin: 15.5 g/dL (ref 13.0–17.0)
MCH: 30.2 pg (ref 26.0–34.0)
MCHC: 33.5 g/dL (ref 30.0–36.0)
MCV: 90.1 fL (ref 80.0–100.0)
Platelets: 289 10*3/uL (ref 150–400)
RBC: 5.13 MIL/uL (ref 4.22–5.81)
RDW: 12.6 % (ref 11.5–15.5)
WBC: 6.6 10*3/uL (ref 4.0–10.5)
nRBC: 0 % (ref 0.0–0.2)

## 2021-07-12 LAB — COMPREHENSIVE METABOLIC PANEL
ALT: 70 U/L — ABNORMAL HIGH (ref 0–44)
AST: 38 U/L (ref 15–41)
Albumin: 4.1 g/dL (ref 3.5–5.0)
Alkaline Phosphatase: 88 U/L (ref 38–126)
Anion gap: 8 (ref 5–15)
BUN: 12 mg/dL (ref 6–20)
CO2: 24 mmol/L (ref 22–32)
Calcium: 9.4 mg/dL (ref 8.9–10.3)
Chloride: 101 mmol/L (ref 98–111)
Creatinine, Ser: 0.8 mg/dL (ref 0.61–1.24)
GFR, Estimated: >60 mL/min (ref >60)
Glucose, Bld: 149 mg/dL — ABNORMAL HIGH (ref 70–99)
Potassium: 3.5 mmol/L (ref 3.5–5.1)
Sodium: 133 mmol/L — ABNORMAL LOW (ref 135–145)
Total Bilirubin: 1 mg/dL (ref 0.3–1.2)
Total Protein: 7.6 g/dL (ref 6.5–8.1)

## 2021-07-12 LAB — LIPASE, BLOOD: Lipase: 28 U/L (ref 11–51)

## 2021-07-12 MED ORDER — DICYCLOMINE HCL 20 MG PO TABS: 20.0000 mg | ORAL_TABLET | Freq: Three times a day (TID) | ORAL | 1 refills | Status: DC

## 2021-07-12 MED ORDER — LACTATED RINGERS IV BOLUS
1000.0000 mL | Freq: Once | INTRAVENOUS | Status: AC
  Administered 2021-07-12: 1000 mL via INTRAVENOUS

## 2021-07-12 MED ORDER — KETOROLAC TROMETHAMINE 30 MG/ML IJ SOLN
15.0000 mg | Freq: Once | INTRAMUSCULAR | Status: AC
  Administered 2021-07-12: 15 mg via INTRAVENOUS
  Filled 2021-07-12: qty 1

## 2021-07-12 NOTE — ED Triage Notes (Signed)
Pt to ED for generalized abd pain and diarrhea since yesterday. Denies emesis or fevers.  ?NAD noted. Skin color WDL.  ?

## 2021-07-12 NOTE — Discharge Instructions (Signed)
Please use the Bentyl medication to help with abdominal spasming and pain. ? ?Stay hydrated. ? ?If you develop any bloody diarrhea, high fevers or severely worsening pain with your symptoms then please return to the ED ?

## 2021-07-12 NOTE — ED Notes (Signed)
See triage note  presents with some stomach pain and diarrhea  states sxs' started on Friday  became worse over the weekend  denies any vomiting or fever ?

## 2021-07-12 NOTE — ED Provider Notes (Signed)
? ?[facility] ?Provider Note ? ? ? Event Date/Time  ? First MD Initiated Contact with Patient 07/12/21 1335   ?  (approximate) ? ? ?History  ? ?Diarrhea and Abdominal Pain ? ? ?HPI ? ?Devin Rush is a 31 y.o. male who presents to the ED for evaluation of Diarrhea and Abdominal Pain ?  ?I reviewed PCP visit from 11/29.  History of GERD, anxiety depression and HLD. ? ?Patient presents to the ED for evaluation of intermittent generalized cramping alongside 2 days of watery diarrhea.  He reports up to 5 stools per day.  No recent antibiotics.  No fevers, emesis, dysuria.  Reports feeling dizzy when he stood up 1 time this morning, and concern for dehydration so he presents to the ED for evaluation. ? ?Denies any fever, syncopal episodes, recent antibiotics.  Denies any abdominal pain right now, just reports feeling a little bit queasy and that he might need to pass a bowel movement. ?His niece is sick with a possible gastroenteritis as well ? ?Physical Exam  ? ?Triage Vital Signs: ?ED Triage Vitals  ?Enc Vitals Group  ?   BP 07/12/21 1301 127/87  ?   Pulse Rate 07/12/21 1301 98  ?   Resp 07/12/21 1301 16  ?   Temp 07/12/21 1301 98.6 ?F (37 ?C)  ?   Temp src --   ?   SpO2 07/12/21 1301 96 %  ?   Weight 07/12/21 1302 178 lb (80.7 kg)  ?   Height 07/12/21 1302 5\' 6"  (1.676 m)  ?   Head Circumference --   ?   Peak Flow --   ?   Pain Score 07/12/21 1302 7  ?   Pain Loc --   ?   Pain Edu? --   ?   Excl. in GC? --   ? ? ?Most recent vital signs: ?Vitals:  ? 07/12/21 1301  ?BP: 127/87  ?Pulse: 98  ?Resp: 16  ?Temp: 98.6 ?F (37 ?C)  ?SpO2: 96%  ? ? ?General: Awake, no distress.  ?CV:  Good peripheral perfusion.  ?Resp:  Normal effort.  ?Abd:  No distention.  Soft and benign throughout ?MSK:  No deformity noted.  ?Neuro:  No focal deficits appreciated. ?Other:   ? ? ?ED Results / Procedures / Treatments  ? ?Labs ?(all labs ordered are listed, but only abnormal results are displayed) ?Labs Reviewed   ?COMPREHENSIVE METABOLIC PANEL - Abnormal; Notable for the following components:  ?    Result Value  ? Sodium 133 (*)   ? Glucose, Bld 149 (*)   ? ALT 70 (*)   ? All other components within normal limits  ?LIPASE, BLOOD  ?CBC  ?URINALYSIS, ROUTINE W REFLEX MICROSCOPIC  ? ? ?EKG ? ? ?RADIOLOGY ? ? ?Official radiology report(s): ?No results found. ? ?PROCEDURES and INTERVENTIONS: ? ?Procedures ? ?Medications  ?ketorolac (TORADOL) 30 MG/ML injection 15 mg (15 mg Intravenous Given 07/12/21 1351)  ?lactated ringers bolus 1,000 mL (1,000 mLs Intravenous New Bag/Given 07/12/21 1349)  ? ? ? ?IMPRESSION / MDM / ASSESSMENT AND PLAN / ED COURSE  ?I reviewed the triage vital signs and the nursing notes. ? ?Largely healthy 31 year old male presents to the ED with intermittent generalized abdominal discomfort and diarrhea, suitable for outpatient management.  He looks systemically well and has a benign examination without abdominal tenderness and certainly no peritoneal features.  Blood work is benign with normal CBC and CMP.  Negative lipase.  Marginal  elevation of his ALT is noted, but he has a history of hepatic steatosis and no RUQ tenderness or emesis, no indications for intra-abdominal imaging.  Feels well and tolerating p.o. intake after IV Toradol and LR.  We will discharge with a prescription for Bentyl and return precautions. ? ?  ? ? ?FINAL CLINICAL IMPRESSION(S) / ED DIAGNOSES  ? ?Final diagnoses:  ?Generalized abdominal pain  ?Diarrhea, unspecified type  ? ? ? ?Rx / DC Orders  ? ?ED Discharge Orders   ? ?      Ordered  ?  dicyclomine (BENTYL) 20 MG tablet  3 times daily before meals & bedtime       ? 07/12/21 1517  ? ?  ?  ? ?  ? ? ? ?Note:  This document was prepared using Dragon voice recognition software and may include unintentional dictation errors. ?  ?Delton Prairie, MD ?07/12/21 1520 ? ?

## 2021-07-19 ENCOUNTER — Emergency Department: Payer: Self-pay

## 2021-07-19 ENCOUNTER — Encounter: Payer: Self-pay | Admitting: Emergency Medicine

## 2021-07-19 DIAGNOSIS — R0789 Other chest pain: Secondary | ICD-10-CM | POA: Insufficient documentation

## 2021-07-19 DIAGNOSIS — R42 Dizziness and giddiness: Secondary | ICD-10-CM | POA: Insufficient documentation

## 2021-07-19 DIAGNOSIS — H538 Other visual disturbances: Secondary | ICD-10-CM | POA: Insufficient documentation

## 2021-07-19 DIAGNOSIS — R0602 Shortness of breath: Secondary | ICD-10-CM | POA: Insufficient documentation

## 2021-07-19 LAB — BASIC METABOLIC PANEL
Anion gap: 11 (ref 5–15)
BUN: 24 mg/dL — ABNORMAL HIGH (ref 6–20)
CO2: 22 mmol/L (ref 22–32)
Calcium: 9.3 mg/dL (ref 8.9–10.3)
Chloride: 101 mmol/L (ref 98–111)
Creatinine, Ser: 0.92 mg/dL (ref 0.61–1.24)
GFR, Estimated: >60 mL/min (ref >60)
Glucose, Bld: 109 mg/dL — ABNORMAL HIGH (ref 70–99)
Potassium: 3.7 mmol/L (ref 3.5–5.1)
Sodium: 134 mmol/L — ABNORMAL LOW (ref 135–145)

## 2021-07-19 LAB — CBC
HCT: 47.2 % (ref 39.0–52.0)
Hemoglobin: 15.9 g/dL (ref 13.0–17.0)
MCH: 30.2 pg (ref 26.0–34.0)
MCHC: 33.7 g/dL (ref 30.0–36.0)
MCV: 89.7 fL (ref 80.0–100.0)
Platelets: 299 10*3/uL (ref 150–400)
RBC: 5.26 MIL/uL (ref 4.22–5.81)
RDW: 13.3 % (ref 11.5–15.5)
WBC: 8 10*3/uL (ref 4.0–10.5)
nRBC: 0 % (ref 0.0–0.2)

## 2021-07-19 LAB — TROPONIN I (HIGH SENSITIVITY): Troponin I (High Sensitivity): <2 ng/L (ref <18)

## 2021-07-19 NOTE — ED Triage Notes (Signed)
Pt to ED for left sided CP that started this evening rating it 7/10. He states the pain radiates towards his left arm. Denies Sob.  ?

## 2021-07-20 ENCOUNTER — Emergency Department
Admission: EM | Admit: 2021-07-20 | Discharge: 2021-07-20 | Disposition: A | Discharge status: Home / Self Care | Payer: Self-pay | Attending: Emergency Medicine | Admitting: Emergency Medicine

## 2021-07-20 DIAGNOSIS — R0789 Other chest pain: Secondary | ICD-10-CM

## 2021-07-20 LAB — TROPONIN I (HIGH SENSITIVITY): Troponin I (High Sensitivity): <2 ng/L (ref <18)

## 2021-07-20 LAB — D-DIMER, QUANTITATIVE: D-Dimer, Quant: <0.27 ug/mL-FEU (ref 0.00–0.50)

## 2021-07-20 MED ORDER — KETOROLAC TROMETHAMINE 30 MG/ML IJ SOLN
30.0000 mg | Freq: Once | INTRAMUSCULAR | Status: AC
  Administered 2021-07-20: 30 mg via INTRAVENOUS
  Filled 2021-07-20: qty 1

## 2021-07-20 MED ORDER — IBUPROFEN 800 MG PO TABS: 800.0000 mg | ORAL_TABLET | Freq: Three times a day (TID) | ORAL | 0 refills | Status: AC | PRN

## 2021-07-20 NOTE — ED Provider Notes (Signed)
? ?[facility] ?Provider Note ? ? ? Event Date/Time  ? First MD Initiated Contact with Patient 07/20/21 0158   ?  (approximate) ? ? ?History  ? ?Chest Pain ? ? ?HPI ? ?Devin Rush is a 31 y.o. male with no history of GERD who presents to the emergency department complaints of chest pain.  States he woke up this morning and felt lightheaded and had blurry vision.  Started having pressure in the left side of his chest that was painful with palpation and deep inspiration.  States his left arm felt uncomfortable when holding the steering well and he had pressure and tingling without numbness or weakness.  States he did feel short of breath.  No nausea, vomiting, diaphoresis.  No fevers or cough.  No family history of premature CAD.  No history of PE, DVT, exogenous estrogen use, recent fractures, surgery, trauma, hospitalization, prolonged travel or other immobilization. No lower extremity swelling or pain. No calf tenderness.  Denies any trauma to the chest or arm but does state that he started a new job 3 weeks ago and has been having to lift heavy things recently. ? ? ?History provided by patient. ? ? ? ?Past Medical History:  ?Diagnosis Date  ? Anxiety   ? ? ?Past Surgical History:  ?Procedure Laterality Date  ? APPENDECTOMY    ? ? ?MEDICATIONS:  ?Prior to Admission medications   ?Medication Sig Start Date End Date Taking? Authorizing Provider  ?dicyclomine (BENTYL) 20 MG tablet Take 1 tablet (20 mg total) by mouth 4 (four) times daily -  before meals and at bedtime. 07/12/21   Delton Prairie, MD  ?omeprazole (PRILOSEC) 40 MG capsule Take 1 capsule (40 mg total) by mouth daily. 02/02/21   Larae Grooms, NP  ?sertraline (ZOLOFT) 50 MG tablet Take 1 tablet (50 mg total) by mouth daily. 02/02/21   Larae Grooms, NP  ? ? ?Physical Exam  ? ?Triage Vital Signs: ?ED Triage Vitals  ?Enc Vitals Group  ?   BP 07/19/21 2044 135/86  ?   Pulse Rate 07/19/21 2044 91  ?   Resp 07/19/21 2044 18  ?    Temp 07/19/21 2044 99.1 ?F (37.3 ?C)  ?   Temp Source 07/19/21 2044 Oral  ?   SpO2 07/19/21 2044 94 %  ?   Weight 07/19/21 2041 175 lb (79.4 kg)  ?   Height 07/19/21 2041 5\' 6"  (1.676 m)  ?   Head Circumference --   ?   Peak Flow --   ?   Pain Score 07/19/21 2041 7  ?   Pain Loc --   ?   Pain Edu? --   ?   Excl. in GC? --   ? ? ?Most recent vital signs: ?Vitals:  ? 07/20/21 0254 07/20/21 0300  ?BP: (!) 128/93 126/88  ?Pulse: 81 73  ?Resp: 16 19  ?Temp:    ?SpO2: 100% 97%  ? ? ?CONSTITUTIONAL: Alert and oriented and responds appropriately to questions. Well-appearing; well-nourished ?HEAD: Normocephalic, atraumatic ?EYES: Conjunctivae clear, pupils appear equal, sclera nonicteric ?ENT: normal nose; moist mucous membranes ?NECK: Supple, normal ROM ?CARD: RRR; S1 and S2 appreciated; no murmurs, no clicks, no rubs, no gallops ?RESP: Normal chest excursion without splinting or tachypnea; breath sounds clear and equal bilaterally; no wheezes, no rhonchi, no rales, no hypoxia or respiratory distress, speaking full sentences ?ABD/GI: Normal bowel sounds; non-distended; soft, non-tender, no rebound, no guarding, no peritoneal signs ?BACK: The back appears  normal ?EXT: Normal ROM in all joints; no deformity noted, no edema; no cyanosis; no calf tenderness or calf swelling, 2+ radial pulses bilaterally, no tenderness or deformity of the left upper extremity, normal sensation in the left arm and normal capillary refill, compartments of the left arm are soft. ?SKIN: Normal color for age and race; warm; no rash on exposed skin ?NEURO: Moves all extremities equally, normal speech, normal sensation diffusely, normal gait, no facial asymmetry ?PSYCH: The patient's mood and manner are appropriate. ? ? ?ED Results / Procedures / Treatments  ? ?LABS: ?(all labs ordered are listed, but only abnormal results are displayed) ?Labs Reviewed  ?BASIC METABOLIC PANEL - Abnormal; Notable for the following components:  ?    Result Value  ?  Sodium 134 (*)   ? Glucose, Bld 109 (*)   ? BUN 24 (*)   ? All other components within normal limits  ?CBC  ?D-DIMER, QUANTITATIVE  ?TROPONIN I (HIGH SENSITIVITY)  ?TROPONIN I (HIGH SENSITIVITY)  ? ? ? ?EKG: ? EKG Interpretation ? ?Date/Time:  Monday Jul 19 2021 20:44:18 EDT ?Ventricular Rate:  93 ?PR Interval:  136 ?QRS Duration: 92 ?QT Interval:  356 ?QTC Calculation: 442 ?R Axis:   -3 ?Text Interpretation: Normal sinus rhythm Incomplete right bundle branch block Possible Anterior infarct (cited on or before 04-Nov-2017) Abnormal ECG When compared with ECG of 30-Mar-2020 11:43, No significant change was found Confirmed by Rochele RaringWard, Phuoc Huy [phone]([id number]) on 07/20/2021 1:59:44 AM ?  ? ?  ? ? ? ?RADIOLOGY: ?My personal review and interpretation of imaging: Chest x-ray clear. ? ?I have personally reviewed all radiology reports.   ?DG Chest 2 View ? ?Result Date: 07/19/2021 ?CLINICAL DATA:  Chest pain EXAM: CHEST - 2 VIEW COMPARISON:  03/30/2020 FINDINGS: The heart size and mediastinal contours are within normal limits. Both lungs are clear. The visualized skeletal structures are unremarkable. IMPRESSION: No active cardiopulmonary disease. Electronically Signed   By: Sharlet SalinaMichael  Brown M.D.   On: 07/19/2021 21:39   ? ? ?PROCEDURES: ? ?Critical Care performed: No ? ? ? ? ?.1-3 Lead EKG Interpretation ?Performed by: Jaydence Arnesen, Layla MawKristen N, DO ?Authorized by: Coline Calkin, Layla MawKristen N, DO  ? ?  Interpretation: normal   ?  ECG rate:  73 ?  ECG rate assessment: normal   ?  Rhythm: sinus rhythm   ?  Ectopy: none   ?  Conduction: normal   ? ? ? ?IMPRESSION / MDM / ASSESSMENT AND PLAN / ED COURSE  ?I reviewed the triage vital signs and the nursing notes. ? ? ? ?Patient here with chest pain, shortness of breath.   ? ?The patient is on the cardiac monitor to evaluate for evidence of arrhythmia and/or significant heart rate changes. ? ? ?DIFFERENTIAL DIAGNOSIS (includes but not limited to):   ACS, PE, dissection, pneumonia, pneumothorax, CHF, musculoskeletal  pain ? ? ?PLAN: We will obtain CBC, BMP, troponin, D-dimer, EKG, chest x-ray.  Will give Toradol for pain. ? ? ?MEDICATIONS GIVEN IN ED: ?Medications  ?ketorolac (TORADOL) 30 MG/ML injection 30 mg (30 mg Intravenous Given 07/20/21 0248)  ? ? ? ?ED COURSE: Patient's labs show no leukocytosis, normal hemoglobin.  Normal electrolytes.  Troponin x2 negative.  D-dimer negative.  EKG shows incomplete right bundle branch block with no changes compared to previous.  Chest x-ray reviewed and interpreted by myself and radiologist and shows no infiltrate, edema or pneumothorax.  Patient reports feeling better after Toradol.  I suspect that his pain is musculoskeletal in nature.  Recommended Tylenol, Motrin as needed.  Patient verbalized understanding and is comfortable with this plan. ? ? ?At this time, I do not feel there is any life-threatening condition present. I reviewed all nursing notes, vitals, pertinent previous records.  All lab and urine results, EKGs, imaging ordered have been independently reviewed and interpreted by myself.  I reviewed all available radiology reports from any imaging ordered this visit.  Based on my assessment, I feel the patient is safe to be discharged home without further emergent workup and can continue workup as an outpatient as needed. Discussed all findings, treatment plan as well as usual and customary return precautions with patient.  They verbalize understanding and are comfortable with this plan.  Outpatient follow-up has been provided as needed.  All questions have been answered. ? ? ? ?CONSULTS: No admission needed at this time given patient's work-up has been reassuring.  He has no significant risk factors for ACS. ? ? ?OUTSIDE RECORDS REVIEWED: Reviewed patient's last primary care note with Larae Grooms on 02/02/2021. ? ? ? ? ? ? ? ? ?FINAL CLINICAL IMPRESSION(S) / ED DIAGNOSES  ? ?Final diagnoses:  ?Chest wall pain  ? ? ? ?Rx / DC Orders  ? ?ED Discharge Orders   ? ?       Ordered  ?  ibuprofen (ADVIL) 800 MG tablet  Every 8 hours PRN       ? 07/20/21 0319  ? ?  ?  ? ?  ? ? ? ?Note:  This document was prepared using Dragon voice recognition software and may include unintentional

## 2021-07-20 NOTE — Discharge Instructions (Signed)
You may alternate Tylenol 1000 mg every 6 hours as needed for pain, fever and Ibuprofen 800 mg every 6-8 hours as needed for pain, fever.  Please take Ibuprofen with food.  Do not take more than 4000 mg of Tylenol (acetaminophen) in a 24 hour period. ° °

## 2021-07-30 NOTE — Progress Notes (Deleted)
There were no vitals taken for this visit.   Subjective:    Patient ID: Devin Rush, male    DOB: 1990/08/01, 31 y.o.   MRN: [id number]  HPI: Devin Rush is a 31 y.o. male  No chief complaint on file.  ANXIETY/DEPRESSION Patient states his anxiety is a lot better. The Zoloft is helping a lot.  Patient states he feels like this is a good dose for him.  He feels like it is still working well for him.   [place] Office Visit from 02/02/2021 in [place]  PHQ-9 Total Score 3         02/02/2021    9:52 AM 11/02/2020   10:15 AM 09/08/2020   10:30 AM 08/06/2020   11:33 AM  GAD 7 : Generalized Anxiety Score  Nervous, Anxious, on Edge 1 0 1 2  Control/stop worrying 1 0 1 2  Worry too much - different things 1 0 1 3  Trouble relaxing 0 0 0 2  Restless 0 0 0 2  Easily annoyed or irritable 0 0 0 2  Afraid - awful might happen 0 0 0 2  Total GAD 7 Score 3 0 3 15  Anxiety Difficulty Not difficult at all Not difficult at all Not difficult at all Somewhat difficult   GERD Patient states the omeprazole is still helping his symptoms.  States he has had some occassions where the reflux is bothering him a lot.  Patient states it is not as bad as it was before. He feels like he is not able to burp and then he takes the alca seltzer which helps his symptoms.  HYPERLIPIDEMIA Patient hasn't really changed his diet.    Relevant past medical, surgical, family and social history reviewed and updated as indicated. Interim medical history since our last visit reviewed. Allergies and medications reviewed and updated.  Review of Systems  Eyes:  Negative for visual disturbance.  Respiratory:  Negative for chest tightness and shortness of breath.   Cardiovascular:  Negative for chest pain, palpitations and leg swelling.  Gastrointestinal:        Heart burn  Neurological:  Negative for dizziness, light-headedness and headaches.  Psychiatric/Behavioral:  Positive  for dysphoric mood. Negative for suicidal ideas. The patient is not nervous/anxious.    Per HPI unless specifically indicated above     Objective:    There were no vitals taken for this visit.  Wt Readings from Last 3 Encounters:  07/19/21 175 lb (79.4 kg)  07/12/21 178 lb (80.7 kg)  02/02/21 173 lb (78.5 kg)    Physical Exam Vitals and nursing note reviewed.  Constitutional:      General: He is not in acute distress.    Appearance: Normal appearance. He is not ill-appearing, toxic-appearing or diaphoretic.  HENT:     Head: Normocephalic.     Right Ear: External ear normal.     Left Ear: External ear normal.     Nose: Nose normal. No congestion or rhinorrhea.     Mouth/Throat:     Mouth: Mucous membranes are moist.  Eyes:     General:        Right eye: No discharge.        Left eye: No discharge.     Extraocular Movements: Extraocular movements intact.     Conjunctiva/sclera: Conjunctivae normal.     Pupils: Pupils are equal, round, and reactive to light.  Cardiovascular:     Rate and Rhythm: Normal  rate and regular rhythm.     Heart sounds: No murmur heard. Pulmonary:     Effort: Pulmonary effort is normal. No respiratory distress.     Breath sounds: Normal breath sounds. No wheezing, rhonchi or rales.  Abdominal:     General: Abdomen is flat. Bowel sounds are normal.  Musculoskeletal:     Cervical back: Normal range of motion and neck supple.  Skin:    General: Skin is warm and dry.     Capillary Refill: Capillary refill takes less than 2 seconds.  Neurological:     General: No focal deficit present.     Mental Status: He is alert and oriented to person, place, and time.  Psychiatric:        Mood and Affect: Mood normal.        Behavior: Behavior normal.        Thought Content: Thought content normal.        Judgment: Judgment normal.    Results for orders placed or performed during the hospital encounter of 99991111  Basic metabolic panel  Result Value Ref  Range   Sodium 134 (L) 135 - 145 mmol/L   Potassium 3.7 3.5 - 5.1 mmol/L   Chloride 101 98 - 111 mmol/L   CO2 22 22 - 32 mmol/L   Glucose, Bld 109 (H) 70 - 99 mg/dL   BUN 24 (H) 6 - 20 mg/dL   Creatinine, Ser 0.92 0.61 - 1.24 mg/dL   Calcium 9.3 8.9 - 10.3 mg/dL   GFR, Estimated >60 >60 mL/min   Anion gap 11 5 - 15  CBC  Result Value Ref Range   WBC 8.0 4.0 - 10.5 K/uL   RBC 5.26 4.22 - 5.81 MIL/uL   Hemoglobin 15.9 13.0 - 17.0 g/dL   HCT 47.2 39.0 - 52.0 %   MCV 89.7 80.0 - 100.0 fL   MCH 30.2 26.0 - 34.0 pg   MCHC 33.7 30.0 - 36.0 g/dL   RDW 13.3 11.5 - 15.5 %   Platelets 299 150 - 400 K/uL   nRBC 0.0 0.0 - 0.2 %  D-dimer, quantitative  Result Value Ref Range   D-Dimer, Quant <0.27 0.00 - 0.50 ug/mL-FEU  Troponin I (High Sensitivity)  Result Value Ref Range   Troponin I (High Sensitivity) <2 <18 ng/L  Troponin I (High Sensitivity)  Result Value Ref Range   Troponin I (High Sensitivity) <2 <18 ng/L      Assessment & Plan:   Problem List Items Addressed This Visit       Other   Anxiety and depression   Mixed hyperlipidemia - Primary     Follow up plan: No follow-ups on file.

## 2021-08-02 ENCOUNTER — Ambulatory Visit: Payer: 59 | Admitting: Nurse Practitioner

## 2021-08-03 ENCOUNTER — Ambulatory Visit: Payer: Self-pay | Admitting: Nurse Practitioner

## 2021-08-03 DIAGNOSIS — E782 Mixed hyperlipidemia: Secondary | ICD-10-CM

## 2021-08-03 DIAGNOSIS — F419 Anxiety disorder, unspecified: Secondary | ICD-10-CM

## 2022-01-17 NOTE — Progress Notes (Deleted)
   There were no vitals taken for this visit.   Subjective:    Patient ID: Donathan Buller, male    DOB: June 11, 1990, 31 y.o.   MRN: [id number]  HPI: Leo Fray is a 31 y.o. male  No chief complaint on file.   Relevant past medical, surgical, family and social history reviewed and updated as indicated. Interim medical history since our last visit reviewed. Allergies and medications reviewed and updated.  Review of Systems  Per HPI unless specifically indicated above     Objective:    There were no vitals taken for this visit.  Wt Readings from Last 3 Encounters:  07/19/21 175 lb (79.4 kg)  07/12/21 178 lb (80.7 kg)  02/02/21 173 lb (78.5 kg)    Physical Exam  Results for orders placed or performed during the hospital encounter of 07/20/21  Basic metabolic panel  Result Value Ref Range   Sodium 134 (L) 135 - 145 mmol/L   Potassium 3.7 3.5 - 5.1 mmol/L   Chloride 101 98 - 111 mmol/L   CO2 22 22 - 32 mmol/L   Glucose, Bld 109 (H) 70 - 99 mg/dL   BUN 24 (H) 6 - 20 mg/dL   Creatinine, Ser 3.53 0.61 - 1.24 mg/dL   Calcium 9.3 8.9 - 61.4 mg/dL   GFR, Estimated >43 >15 mL/min   Anion gap 11 5 - 15  CBC  Result Value Ref Range   WBC 8.0 4.0 - 10.5 K/uL   RBC 5.26 4.22 - 5.81 MIL/uL   Hemoglobin 15.9 13.0 - 17.0 g/dL   HCT 40.0 86.7 - 61.9 %   MCV 89.7 80.0 - 100.0 fL   MCH 30.2 26.0 - 34.0 pg   MCHC 33.7 30.0 - 36.0 g/dL   RDW 50.9 32.6 - 71.2 %   Platelets 299 150 - 400 K/uL   nRBC 0.0 0.0 - 0.2 %  D-dimer, quantitative  Result Value Ref Range   D-Dimer, Quant <0.27 0.00 - 0.50 ug/mL-FEU  Troponin I (High Sensitivity)  Result Value Ref Range   Troponin I (High Sensitivity) <2 <18 ng/L  Troponin I (High Sensitivity)  Result Value Ref Range   Troponin I (High Sensitivity) <2 <18 ng/L      Assessment & Plan:   Problem List Items Addressed This Visit   None    Follow up plan: No follow-ups on file.

## 2022-01-18 ENCOUNTER — Ambulatory Visit: Payer: Self-pay | Admitting: Nurse Practitioner

## 2022-02-17 NOTE — Progress Notes (Deleted)
   There were no vitals taken for this visit.   Subjective:    Patient ID: Ahman Dugdale, male    DOB: 01/05/1991, 31 y.o.   MRN: [id number]  HPI: Naasir Carreira is a 30 y.o. male  No chief complaint on file.  ARM SWELLING Duration: {Blank single:19197::"chronic","days","weeks","months"} Location: {Blank single:19197::"left","right","bilateral","wrist","forearm","elbow","upper arm","biceps","triceps","shoulder"} Mechanism of injury: {Blank single:19197::"trauma","unknown"} Onset: {Blank single:19197::"sudden","gradual"} Severity: {Blank single:19197::"mild","moderate","severe","1/10","2/10","3/10","4/10","5/10","6/10","7/10","8/10","9/10","10/10"}  Quality:  {Blank multiple:19196::"sharp","dull","aching","burning","cramping","ill-defined","itchy","pressure-like","pulling","shooting","sore","stabbing","tender","tearing","throbbing"} Frequency: {Blank single:19197::"constant","intermittent","occasional","rare","every few minutes","a few times a hour","a few times a day","a few times a week","a few times a month","a few times a year"} Radiation: {Blank single:19197::"yes","no"} Aggravating factors: {Blank multiple:19196::"weight bearing","walking","running","stairs","bending","movement","prolonged sitting"}  Alleviating factors: {Blank multiple:19196::"nothing","ice","physical therapy","HEP","APAP","NSAIDs","brace","crutches","rest"}  Status: {Blank multiple:19196::"better","worse","stable","fluctuating"} Treatments attempted: {Blank multiple:19196::"none","rest","ice","heat","APAP","ibuprofen","aleve","physical therapy","HEP"}  Relief with NSAIDs?:  {Blank single:19197::"No NSAIDs Taken","no","mild","moderate","significant"} Swelling: {Blank single:19197::"yes","no"} Redness: {Blank single:19197::"yes","no"}  Warmth: {Blank single:19197::"yes","no"} Trauma: {Blank single:19197::"yes","no"} Chest pain: {Blank single:19197::"yes","no"}  Shortness of breath: {Blank  single:19197::"yes","no"}  Fever: {Blank single:19197::"yes","no"} Decreased sensation: {Blank single:19197::"yes","no"} Paresthesias: {Blank single:19197::"yes","no"} Weakness: {Blank single:19197::"yes","no"}  Relevant past medical, surgical, family and social history reviewed and updated as indicated. Interim medical history since our last visit reviewed. Allergies and medications reviewed and updated.  Review of Systems  Per HPI unless specifically indicated above     Objective:    There were no vitals taken for this visit.  Wt Readings from Last 3 Encounters:  07/19/21 175 lb (79.4 kg)  07/12/21 178 lb (80.7 kg)  02/02/21 173 lb (78.5 kg)    Physical Exam  Results for orders placed or performed during the hospital encounter of 16/96/78  Basic metabolic panel  Result Value Ref Range   Sodium 134 (L) 135 - 145 mmol/L   Potassium 3.7 3.5 - 5.1 mmol/L   Chloride 101 98 - 111 mmol/L   CO2 22 22 - 32 mmol/L   Glucose, Bld 109 (H) 70 - 99 mg/dL   BUN 24 (H) 6 - 20 mg/dL   Creatinine, Ser 0.92 0.61 - 1.24 mg/dL   Calcium 9.3 8.9 - 10.3 mg/dL   GFR, Estimated >60 >60 mL/min   Anion gap 11 5 - 15  CBC  Result Value Ref Range   WBC 8.0 4.0 - 10.5 K/uL   RBC 5.26 4.22 - 5.81 MIL/uL   Hemoglobin 15.9 13.0 - 17.0 g/dL   HCT 47.2 39.0 - 52.0 %   MCV 89.7 80.0 - 100.0 fL   MCH 30.2 26.0 - 34.0 pg   MCHC 33.7 30.0 - 36.0 g/dL   RDW 13.3 11.5 - 15.5 %   Platelets 299 150 - 400 K/uL   nRBC 0.0 0.0 - 0.2 %  D-dimer, quantitative  Result Value Ref Range   D-Dimer, Quant <0.27 0.00 - 0.50 ug/mL-FEU  Troponin I (High Sensitivity)  Result Value Ref Range   Troponin I (High Sensitivity) <2 <18 ng/L  Troponin I (High Sensitivity)  Result Value Ref Range   Troponin I (High Sensitivity) <2 <18 ng/L      Assessment & Plan:   Problem List Items Addressed This Visit   None    Follow up plan: No follow-ups on file.

## 2022-02-21 ENCOUNTER — Ambulatory Visit: Payer: Self-pay | Admitting: Nurse Practitioner

## 2022-03-09 NOTE — Progress Notes (Unsigned)
   There were no vitals taken for this visit.   Subjective:    Patient ID: Devin Rush, male    DOB: 01/05/1991, 32 y.o.   MRN: [id number]  HPI: Devin Rush is a 32 y.o. male  No chief complaint on file.  ARM SWELLING Duration: {Blank single:19197::"chronic","days","weeks","months"} Location: {Blank single:19197::"left","right","bilateral","wrist","forearm","elbow","upper arm","biceps","triceps","shoulder"} Mechanism of injury: {Blank single:19197::"trauma","unknown"} Onset: {Blank single:19197::"sudden","gradual"} Severity: {Blank single:19197::"mild","moderate","severe","1/10","2/10","3/10","4/10","5/10","6/10","7/10","8/10","9/10","10/10"}  Quality:  {Blank multiple:19196::"sharp","dull","aching","burning","cramping","ill-defined","itchy","pressure-like","pulling","shooting","sore","stabbing","tender","tearing","throbbing"} Frequency: {Blank single:19197::"constant","intermittent","occasional","rare","every few minutes","a few times a hour","a few times a day","a few times a week","a few times a month","a few times a year"} Radiation: {Blank single:19197::"yes","no"} Aggravating factors: {Blank multiple:19196::"weight bearing","walking","running","stairs","bending","movement","prolonged sitting"}  Alleviating factors: {Blank multiple:19196::"nothing","ice","physical therapy","HEP","APAP","NSAIDs","brace","crutches","rest"}  Status: {Blank multiple:19196::"better","worse","stable","fluctuating"} Treatments attempted: {Blank multiple:19196::"none","rest","ice","heat","APAP","ibuprofen","aleve","physical therapy","HEP"}  Relief with NSAIDs?:  {Blank single:19197::"No NSAIDs Taken","no","mild","moderate","significant"} Swelling: {Blank single:19197::"yes","no"} Redness: {Blank single:19197::"yes","no"}  Warmth: {Blank single:19197::"yes","no"} Trauma: {Blank single:19197::"yes","no"} Chest pain: {Blank single:19197::"yes","no"}  Shortness of breath: {Blank  single:19197::"yes","no"}  Fever: {Blank single:19197::"yes","no"} Decreased sensation: {Blank single:19197::"yes","no"} Paresthesias: {Blank single:19197::"yes","no"} Weakness: {Blank single:19197::"yes","no"}  Relevant past medical, surgical, family and social history reviewed and updated as indicated. Interim medical history since our last visit reviewed. Allergies and medications reviewed and updated.  Review of Systems  Per HPI unless specifically indicated above     Objective:    There were no vitals taken for this visit.  Wt Readings from Last 3 Encounters:  07/19/21 175 lb (79.4 kg)  07/12/21 178 lb (80.7 kg)  02/02/21 173 lb (78.5 kg)    Physical Exam  Results for orders placed or performed during the hospital encounter of 16/96/78  Basic metabolic panel  Result Value Ref Range   Sodium 134 (L) 135 - 145 mmol/L   Potassium 3.7 3.5 - 5.1 mmol/L   Chloride 101 98 - 111 mmol/L   CO2 22 22 - 32 mmol/L   Glucose, Bld 109 (H) 70 - 99 mg/dL   BUN 24 (H) 6 - 20 mg/dL   Creatinine, Ser 0.92 0.61 - 1.24 mg/dL   Calcium 9.3 8.9 - 10.3 mg/dL   GFR, Estimated >60 >60 mL/min   Anion gap 11 5 - 15  CBC  Result Value Ref Range   WBC 8.0 4.0 - 10.5 K/uL   RBC 5.26 4.22 - 5.81 MIL/uL   Hemoglobin 15.9 13.0 - 17.0 g/dL   HCT 47.2 39.0 - 52.0 %   MCV 89.7 80.0 - 100.0 fL   MCH 30.2 26.0 - 34.0 pg   MCHC 33.7 30.0 - 36.0 g/dL   RDW 13.3 11.5 - 15.5 %   Platelets 299 150 - 400 K/uL   nRBC 0.0 0.0 - 0.2 %  D-dimer, quantitative  Result Value Ref Range   D-Dimer, Quant <0.27 0.00 - 0.50 ug/mL-FEU  Troponin I (High Sensitivity)  Result Value Ref Range   Troponin I (High Sensitivity) <2 <18 ng/L  Troponin I (High Sensitivity)  Result Value Ref Range   Troponin I (High Sensitivity) <2 <18 ng/L      Assessment & Plan:   Problem List Items Addressed This Visit   None    Follow up plan: No follow-ups on file.

## 2022-03-10 ENCOUNTER — Encounter: Payer: Self-pay | Admitting: Nurse Practitioner

## 2022-03-10 ENCOUNTER — Ambulatory Visit: Payer: BC Managed Care – PPO | Admitting: Nurse Practitioner

## 2022-03-10 VITALS — BP 122/78 | HR 73 | Temp 98.1°F | Wt 179.3 lb

## 2022-03-10 DIAGNOSIS — R002 Palpitations: Secondary | ICD-10-CM | POA: Diagnosis not present

## 2022-03-10 DIAGNOSIS — K219 Gastro-esophageal reflux disease without esophagitis: Secondary | ICD-10-CM | POA: Diagnosis not present

## 2022-03-10 DIAGNOSIS — R7309 Other abnormal glucose: Secondary | ICD-10-CM | POA: Diagnosis not present

## 2022-03-10 MED ORDER — PANTOPRAZOLE SODIUM 20 MG PO TBEC: 20.0000 mg | DELAYED_RELEASE_TABLET | Freq: Every day | ORAL | 0 refills | Status: DC

## 2022-03-10 NOTE — Progress Notes (Signed)
Results discussed with patient during visit.

## 2022-03-10 NOTE — Assessment & Plan Note (Signed)
Chronic. Symptoms could be related to GERD or Anxiety. Will change omperazole to pantoprazole.  Follow up in 4 weeks. Call sooner if concerns arise.

## 2022-03-11 LAB — COMPREHENSIVE METABOLIC PANEL
ALT: 136 IU/L — ABNORMAL HIGH (ref 0–44)
AST: 63 IU/L — ABNORMAL HIGH (ref 0–40)
Albumin/Globulin Ratio: 1.6 (ref 1.2–2.2)
Albumin: 4.5 g/dL (ref 4.3–5.2)
Alkaline Phosphatase: 112 IU/L (ref 44–121)
BUN/Creatinine Ratio: 15 (ref 9–20)
BUN: 12 mg/dL (ref 6–20)
Bilirubin Total: 0.3 mg/dL (ref 0.0–1.2)
CO2: 23 mmol/L (ref 20–29)
Calcium: 9.2 mg/dL (ref 8.7–10.2)
Chloride: 101 mmol/L (ref 96–106)
Creatinine, Ser: 0.8 mg/dL (ref 0.76–1.27)
Globulin, Total: 2.9 g/dL (ref 1.5–4.5)
Glucose: 110 mg/dL — ABNORMAL HIGH (ref 70–99)
Potassium: 4 mmol/L (ref 3.5–5.2)
Sodium: 138 mmol/L (ref 134–144)
Total Protein: 7.4 g/dL (ref 6.0–8.5)
eGFR: 122 mL/min/{1.73_m2} (ref >59)

## 2022-03-11 LAB — HEMOGLOBIN A1C
Est. average glucose Bld gHb Est-mCnc: 128 mg/dL
Hgb A1c MFr Bld: 6.1 % — ABNORMAL HIGH (ref 4.8–5.6)

## 2022-03-11 NOTE — Progress Notes (Signed)
Please let patient know that his lab work shows that he is prediabetic.  A1c is 6.1.  I recommend a low carb diet and exercise as discussed during our visit yesterday.  His liver enzymes are also elevated.  I recommend avoiding tylenol and alcohol and we will recheck them at future visits.

## 2022-04-07 ENCOUNTER — Ambulatory Visit: Payer: BC Managed Care – PPO | Admitting: Nurse Practitioner

## 2022-04-18 NOTE — Progress Notes (Deleted)
There were no vitals taken for this visit.   Subjective:    Patient ID: Devin Rush, male    DOB: 04-22-1990, 32 y.o.   MRN: [id number]  HPI: Devin Rush is a 32 y.o. male  No chief complaint on file.  Patient states that after he eats he feels a pause in his heart beat.  Patient states it started happening about a week and a half a go.  States he has had a lot of stress in his life and feels like it may be anxiety related. Not sure if it might be GERD related due to it happening after he eats.  He is taking Omeprazole daily.  Denies SOB or chest pain.   Relevant past medical, surgical, family and social history reviewed and updated as indicated. Interim medical history since our last visit reviewed. Allergies and medications reviewed and updated.  Review of Systems  Respiratory:  Negative for shortness of breath.   Cardiovascular:  Positive for palpitations. Negative for chest pain.    Per HPI unless specifically indicated above     Objective:    There were no vitals taken for this visit.  Wt Readings from Last 3 Encounters:  03/10/22 179 lb 4.8 oz (81.3 kg)  07/19/21 175 lb (79.4 kg)  07/12/21 178 lb (80.7 kg)    Physical Exam Vitals and nursing note reviewed.  Constitutional:      General: He is not in acute distress.    Appearance: Normal appearance. He is not ill-appearing, toxic-appearing or diaphoretic.  HENT:     Head: Normocephalic.     Right Ear: External ear normal.     Left Ear: External ear normal.     Nose: Nose normal. No congestion or rhinorrhea.     Mouth/Throat:     Mouth: Mucous membranes are moist.  Eyes:     General:        Right eye: No discharge.        Left eye: No discharge.     Extraocular Movements: Extraocular movements intact.     Conjunctiva/sclera: Conjunctivae normal.     Pupils: Pupils are equal, round, and reactive to light.  Cardiovascular:     Rate and Rhythm: Normal rate and regular rhythm.     Heart  sounds: No murmur heard. Pulmonary:     Effort: Pulmonary effort is normal. No respiratory distress.     Breath sounds: Normal breath sounds. No wheezing, rhonchi or rales.  Abdominal:     General: Abdomen is flat. Bowel sounds are normal.  Musculoskeletal:     Cervical back: Normal range of motion and neck supple.  Skin:    General: Skin is warm and dry.     Capillary Refill: Capillary refill takes less than 2 seconds.  Neurological:     General: No focal deficit present.     Mental Status: He is alert and oriented to person, place, and time.  Psychiatric:        Mood and Affect: Mood normal.        Behavior: Behavior normal.        Thought Content: Thought content normal.        Judgment: Judgment normal.    Results for orders placed or performed in visit on 03/10/22  Comp Met (CMET)  Result Value Ref Range   Glucose 110 (H) 70 - 99 mg/dL   BUN 12 6 - 20 mg/dL   Creatinine, Ser 0.80 0.76 - 1.27 mg/dL  eGFR 122 >59 mL/min/1.73   BUN/Creatinine Ratio 15 9 - 20   Sodium 138 134 - 144 mmol/L   Potassium 4.0 3.5 - 5.2 mmol/L   Chloride 101 96 - 106 mmol/L   CO2 23 20 - 29 mmol/L   Calcium 9.2 8.7 - 10.2 mg/dL   Total Protein 7.4 6.0 - 8.5 g/dL   Albumin 4.5 4.3 - 5.2 g/dL   Globulin, Total 2.9 1.5 - 4.5 g/dL   Albumin/Globulin Ratio 1.6 1.2 - 2.2   Bilirubin Total 0.3 0.0 - 1.2 mg/dL   Alkaline Phosphatase 112 44 - 121 IU/L   AST 63 (H) 0 - 40 IU/L   ALT 136 (H) 0 - 44 IU/L  HgB A1c  Result Value Ref Range   Hgb A1c MFr Bld 6.1 (H) 4.8 - 5.6 %   Est. average glucose Bld gHb Est-mCnc 128 mg/dL      Assessment & Plan:   Problem List Items Addressed This Visit       Other   Anxiety and depression - Primary     Follow up plan: No follow-ups on file.

## 2022-04-19 ENCOUNTER — Ambulatory Visit: Payer: Self-pay | Admitting: Nurse Practitioner

## 2022-04-19 DIAGNOSIS — F419 Anxiety disorder, unspecified: Secondary | ICD-10-CM

## 2022-07-08 ENCOUNTER — Ambulatory Visit (INDEPENDENT_AMBULATORY_CARE_PROVIDER_SITE_OTHER): Payer: No Typology Code available for payment source | Admitting: Family Medicine

## 2022-07-08 ENCOUNTER — Encounter: Payer: Self-pay | Admitting: Family Medicine

## 2022-07-08 VITALS — BP 123/80 | HR 74 | Temp 98.3°F | Wt 165.6 lb

## 2022-07-08 DIAGNOSIS — L299 Pruritus, unspecified: Secondary | ICD-10-CM

## 2022-07-08 LAB — BAYER DCA HB A1C WAIVED: HB A1C (BAYER DCA - WAIVED): 5.6 % (ref 4.8–5.6)

## 2022-07-08 MED ORDER — PANTOPRAZOLE SODIUM 20 MG PO TBEC: 20.0000 mg | DELAYED_RELEASE_TABLET | Freq: Two times a day (BID) | ORAL | 0 refills | Status: DC

## 2022-07-08 NOTE — Progress Notes (Signed)
BP 123/80   Pulse 74   Temp 98.3 F (36.8 C) (Oral)   Wt 165 lb 9.6 oz (75.1 kg)   SpO2 97%   BMI 26.73 kg/m    Subjective:    Patient ID: Devin Rush, male    DOB: 1990-07-22, 32 y.o.   MRN: [id number]  HPI: Devin Rush is a 32 y.o. male  Chief Complaint  Patient presents with   Pruritis    Pt states skin feels like its tingly all over    Has been itching all over at night for about a couple of months, but way worse in the past couple of weeks. Not in any particular spot. He did switch his cologne in the past couple of months, but no other new exposures. His liver functions were up in the last visit. He has really been working on dropping weight and has lost 14lbs. No tick bites. No other new concerns. No other complaints at this time.   Relevant past medical, surgical, family and social history reviewed and updated as indicated. Interim medical history since our last visit reviewed. Allergies and medications reviewed and updated.  Review of Systems  Constitutional: Negative.   Respiratory: Negative.    Cardiovascular: Negative.   Gastrointestinal:  Positive for abdominal pain (L side midline- not really upper or lower quadrants). Negative for abdominal distention, anal bleeding, blood in stool, constipation, diarrhea, nausea, rectal pain and vomiting.  Musculoskeletal: Negative.   Skin: Negative.   Neurological: Negative.   Psychiatric/Behavioral: Negative.      Per HPI unless specifically indicated above     Objective:    BP 123/80   Pulse 74   Temp 98.3 F (36.8 C) (Oral)   Wt 165 lb 9.6 oz (75.1 kg)   SpO2 97%   BMI 26.73 kg/m   Wt Readings from Last 3 Encounters:  07/08/22 165 lb 9.6 oz (75.1 kg)  03/10/22 179 lb 4.8 oz (81.3 kg)  07/19/21 175 lb (79.4 kg)    Physical Exam Vitals and nursing note reviewed.  Constitutional:      General: He is not in acute distress.    Appearance: Normal appearance. He is not ill-appearing,  toxic-appearing or diaphoretic.  HENT:     Head: Normocephalic and atraumatic.     Right Ear: External ear normal.     Left Ear: External ear normal.     Nose: Nose normal.     Mouth/Throat:     Mouth: Mucous membranes are moist.     Pharynx: Oropharynx is clear.  Eyes:     General: No scleral icterus.       Right eye: No discharge.        Left eye: No discharge.     Extraocular Movements: Extraocular movements intact.     Conjunctiva/sclera: Conjunctivae normal.     Pupils: Pupils are equal, round, and reactive to light.  Cardiovascular:     Rate and Rhythm: Normal rate and regular rhythm.     Pulses: Normal pulses.     Heart sounds: Normal heart sounds. No murmur heard.    No friction rub. No gallop.  Pulmonary:     Effort: Pulmonary effort is normal. No respiratory distress.     Breath sounds: Normal breath sounds. No stridor. No wheezing, rhonchi or rales.  Chest:     Chest wall: No tenderness.  Musculoskeletal:        General: Normal range of motion.     Cervical back: Normal  range of motion and neck supple.  Skin:    General: Skin is warm and dry.     Capillary Refill: Capillary refill takes less than 2 seconds.     Coloration: Skin is not jaundiced or pale.     Findings: No bruising, erythema, lesion or rash.     Comments: Fine flesh-colored papular rash on abdomen, arms and back  Neurological:     General: No focal deficit present.     Mental Status: He is alert and oriented to person, place, and time. Mental status is at baseline.  Psychiatric:        Mood and Affect: Mood normal.        Behavior: Behavior normal.        Thought Content: Thought content normal.        Judgment: Judgment normal.     Results for orders placed or performed in visit on 03/10/22  Comp Met (CMET)  Result Value Ref Range   Glucose 110 (H) 70 - 99 mg/dL   BUN 12 6 - 20 mg/dL   Creatinine, Ser 4.78 0.76 - 1.27 mg/dL   eGFR 295 >62 ZH/YQM/5.78   BUN/Creatinine Ratio 15 9 - 20    Sodium 138 134 - 144 mmol/L   Potassium 4.0 3.5 - 5.2 mmol/L   Chloride 101 96 - 106 mmol/L   CO2 23 20 - 29 mmol/L   Calcium 9.2 8.7 - 10.2 mg/dL   Total Protein 7.4 6.0 - 8.5 g/dL   Albumin 4.5 4.3 - 5.2 g/dL   Globulin, Total 2.9 1.5 - 4.5 g/dL   Albumin/Globulin Ratio 1.6 1.2 - 2.2   Bilirubin Total 0.3 0.0 - 1.2 mg/dL   Alkaline Phosphatase 112 44 - 121 IU/L   AST 63 (H) 0 - 40 IU/L   ALT 136 (H) 0 - 44 IU/L  HgB A1c  Result Value Ref Range   Hgb A1c MFr Bld 6.1 (H) 4.8 - 5.6 %   Est. average glucose Bld gHb Est-mCnc 128 mg/dL      Assessment & Plan:   Problem List Items Addressed This Visit   None Visit Diagnoses     Pruritus    -  Primary   Will check labs given history of hepatic steatosis. Will hold on new cologne for a week. Start zyrtec after that week. Call with any concerns.   Relevant Orders   CBC with Differential/Platelet   Comprehensive metabolic panel   Bayer DCA Hb I6N Waived        Follow up plan: Return in about 2 weeks (around 07/22/2022) for follow up with Clydie Braun.

## 2022-07-09 LAB — CBC WITH DIFFERENTIAL/PLATELET
Basophils Absolute: 0 10*3/uL (ref 0.0–0.2)
Basos: 1 %
EOS (ABSOLUTE): 0.1 10*3/uL (ref 0.0–0.4)
Eos: 2 %
Hematocrit: 46.2 % (ref 37.5–51.0)
Hemoglobin: 15.7 g/dL (ref 13.0–17.7)
Immature Grans (Abs): 0 10*3/uL (ref 0.0–0.1)
Immature Granulocytes: 0 %
Lymphocytes Absolute: 2.7 10*3/uL (ref 0.7–3.1)
Lymphs: 40 %
MCH: 29.6 pg (ref 26.6–33.0)
MCHC: 34 g/dL (ref 31.5–35.7)
MCV: 87 fL (ref 79–97)
Monocytes Absolute: 0.3 10*3/uL (ref 0.1–0.9)
Monocytes: 5 %
Neutrophils Absolute: 3.5 10*3/uL (ref 1.4–7.0)
Neutrophils: 52 %
Platelets: 302 10*3/uL (ref 150–450)
RBC: 5.31 x10E6/uL (ref 4.14–5.80)
RDW: 13.1 % (ref 11.6–15.4)
WBC: 6.6 10*3/uL (ref 3.4–10.8)

## 2022-07-09 LAB — COMPREHENSIVE METABOLIC PANEL
ALT: 34 IU/L (ref 0–44)
AST: 17 IU/L (ref 0–40)
Albumin/Globulin Ratio: 1.6 (ref 1.2–2.2)
Albumin: 5 g/dL (ref 4.1–5.1)
Alkaline Phosphatase: 100 IU/L (ref 44–121)
BUN/Creatinine Ratio: 16 (ref 9–20)
BUN: 12 mg/dL (ref 6–20)
Bilirubin Total: 0.4 mg/dL (ref 0.0–1.2)
CO2: 22 mmol/L (ref 20–29)
Calcium: 10 mg/dL (ref 8.7–10.2)
Chloride: 98 mmol/L (ref 96–106)
Creatinine, Ser: 0.77 mg/dL (ref 0.76–1.27)
Globulin, Total: 3.1 g/dL (ref 1.5–4.5)
Glucose: 80 mg/dL (ref 70–99)
Potassium: 4.1 mmol/L (ref 3.5–5.2)
Sodium: 136 mmol/L (ref 134–144)
Total Protein: 8.1 g/dL (ref 6.0–8.5)
eGFR: 123 mL/min/{1.73_m2} (ref >59)

## 2022-07-13 ENCOUNTER — Telehealth: Payer: Self-pay

## 2022-07-13 NOTE — Telephone Encounter (Signed)
Received a fax stating "Drug covered by patient plan. The preferred alternative is OmeprazoleCapMG, LansoprazoleCAPMGDR, or EsomeprazoleMGDR. Please send over change of medication to pharmacy with strength, directions, quantity and refills. Please advise?

## 2022-12-05 ENCOUNTER — Telehealth: Payer: Self-pay

## 2022-12-05 ENCOUNTER — Encounter: Payer: Self-pay | Admitting: Nurse Practitioner

## 2022-12-05 ENCOUNTER — Ambulatory Visit (INDEPENDENT_AMBULATORY_CARE_PROVIDER_SITE_OTHER): Payer: No Typology Code available for payment source | Admitting: Nurse Practitioner

## 2022-12-05 VITALS — BP 116/76 | HR 89 | Temp 98.0°F | Resp 18 | Wt 174.4 lb

## 2022-12-05 DIAGNOSIS — K219 Gastro-esophageal reflux disease without esophagitis: Secondary | ICD-10-CM | POA: Diagnosis not present

## 2022-12-05 MED ORDER — PANTOPRAZOLE SODIUM 20 MG PO TBEC: 20.0000 mg | DELAYED_RELEASE_TABLET | Freq: Two times a day (BID) | ORAL | 1 refills | Status: DC

## 2022-12-05 MED ORDER — DICYCLOMINE HCL 20 MG PO TABS: 20.0000 mg | ORAL_TABLET | Freq: Three times a day (TID) | ORAL | 4 refills | Status: DC

## 2022-12-05 NOTE — Progress Notes (Signed)
BP 116/76   Pulse 89   Temp 98 F (36.7 C) (Oral)   Resp 18   Wt 174 lb 6.4 oz (79.1 kg)   SpO2 98%   BMI 28.15 kg/m    Subjective:    Patient ID: Devin Rush, male    DOB: [birth year], 32 y.o.   MRN: [id number]  HPI: Devin Rush is a 32 y.o. male  Chief Complaint  Patient presents with   Dysphagia    Patient says he feels pain on both side of his throat when he swallows and inhales. Patient says when he inhales he has a pain that radiates across his shoulder blades. Patient says yesterday he had a lot of mucus built up from him his of acid reflux. Patient says Saturday, he had a lot of spicy food and then the following two days, he has had issues with it.    DYSPHAGIA Has been present since yesterday and early morning today.  Takes Protonix and Bentyl, has been on for about 1 1/2 years -- had stopped Protonix recently for 2 months while in [place] (cold [place] stopped).  Currently is out and still is not taking.  He is also out of Bentyl.  Prior to issues with swallowing was eating "normal [place] food with hot sauce". Duration: days Description of symptom: Has to clear throat and then swallows and feels immediately like something is getting stuck, then when breaths in feels pain radiating to shoulders and into neck.   Onset: Immediately upon swallowing Location of dysphagia: throat Dysphagia to solids only: yes Dysphagia to solids & liquids: no  Frequency:intermittent  Progressively getting worse: no Alleviatiating factors:  clearing throat Provoking factors: unknown Status: fluctuating EGD: no Weight loss: no Sensation of lump in throat: yes Heartburn: yes Odynophagia: no Nausea: yes in the morning Vomiting: no Drooling/nasal regurgitation/food spillage: no Coughing/choking/dysphonia: no Dysarthria: no Hematemesis: no Regurgitation of undigested food/halitosis: yes if eats late at night Chest pain: no     12/05/2022    2:30 PM 07/08/2022    3:29 PM  03/10/2022    8:41 AM 02/02/2021    9:51 AM 11/02/2020   10:14 AM  Depression screen PHQ 2/9  Decreased Interest 1 1 1  0 0  Down, Depressed, Hopeless 1 0 1 0 0  PHQ - 2 Score 2 1 2  0 0  Altered sleeping 0 1 2 1  0  Tired, decreased energy 1 1 1 1 2   Change in appetite 0 1 1 1 1   Feeling bad or failure about yourself  0 0 1 0 0  Trouble concentrating 1 0 1 0 0  Moving slowly or fidgety/restless 0 0 0 0 0  Suicidal thoughts 0 0 0 0 0  PHQ-9 Score 4 4 8 3 3   Difficult doing work/chores Not difficult at all Somewhat difficult Somewhat difficult Not difficult at all Not difficult at all       12/05/2022    2:30 PM 07/08/2022    3:30 PM 03/10/2022    8:41 AM 02/02/2021    9:52 AM  GAD 7 : Generalized Anxiety Score  Nervous, Anxious, on Edge 1 1 1 1   Control/stop worrying 0 1 1 1   Worry too much - different things 1 0 1 1  Trouble relaxing 0 0 1 0  Restless 0 0 1 0  Easily annoyed or irritable 0 1 1 0  Afraid - awful might happen 0 0 1 0  Total GAD 7 Score 2  3 7 3   Anxiety Difficulty Not difficult at all Not difficult at all Somewhat difficult Not difficult at all   Relevant past medical, surgical, family and social history reviewed and updated as indicated. Interim medical history since our last visit reviewed. Allergies and medications reviewed and updated.  Review of Systems  Constitutional:  Negative for activity change, diaphoresis, fatigue and fever.  Respiratory:  Negative for cough, chest tightness, shortness of breath and wheezing.   Cardiovascular:  Negative for chest pain, palpitations and leg swelling.  Gastrointestinal:  Positive for nausea (with heart burn). Negative for abdominal distention, abdominal pain, constipation, diarrhea and vomiting.  Neurological: Negative.   Psychiatric/Behavioral: Negative.      Per HPI unless specifically indicated above     Objective:    BP 116/76   Pulse 89   Temp 98 F (36.7 C) (Oral)   Resp 18   Wt 174 lb 6.4 oz (79.1 kg)    SpO2 98%   BMI 28.15 kg/m   Wt Readings from Last 3 Encounters:  12/05/22 174 lb 6.4 oz (79.1 kg)  07/08/22 165 lb 9.6 oz (75.1 kg)  03/10/22 179 lb 4.8 oz (81.3 kg)    Physical Exam Vitals and nursing note reviewed.  Constitutional:      General: He is awake. He is not in acute distress.    Appearance: He is well-developed and well-groomed. He is obese. He is not ill-appearing or toxic-appearing.  HENT:     Head: Normocephalic.     Right Ear: Hearing and external ear normal.     Left Ear: Hearing and external ear normal.  Eyes:     General: Lids are normal.     Extraocular Movements: Extraocular movements intact.     Conjunctiva/sclera: Conjunctivae normal.  Neck:     Thyroid: No thyromegaly.     Vascular: No carotid bruit.  Cardiovascular:     Rate and Rhythm: Normal rate and regular rhythm.     Heart sounds: Normal heart sounds. No murmur heard.    No gallop.  Pulmonary:     Effort: No accessory muscle usage or respiratory distress.     Breath sounds: Normal breath sounds.  Abdominal:     General: Bowel sounds are normal. There is no distension.     Palpations: Abdomen is soft.     Tenderness: There is no abdominal tenderness.  Musculoskeletal:     Cervical back: Full passive range of motion without pain.     Right lower leg: No edema.     Left lower leg: No edema.  Lymphadenopathy:     Cervical: No cervical adenopathy.  Skin:    General: Skin is warm.     Capillary Refill: Capillary refill takes less than 2 seconds.  Neurological:     Mental Status: He is alert and oriented to person, place, and time.     Deep Tendon Reflexes: Reflexes are normal and symmetric.     Reflex Scores:      Brachioradialis reflexes are 2+ on the right side and 2+ on the left side.      Patellar reflexes are 2+ on the right side and 2+ on the left side. Psychiatric:        Attention and Perception: Attention normal.        Mood and Affect: Mood normal.        Speech: Speech normal.         Behavior: Behavior normal. Behavior is cooperative.  Thought Content: Thought content normal.    Results for orders placed or performed in visit on 07/08/22  CBC with Differential/Platelet  Result Value Ref Range   WBC 6.6 3.4 - 10.8 x10E3/uL   RBC 5.31 4.14 - 5.80 x10E6/uL   Hemoglobin 15.7 13.0 - 17.7 g/dL   Hematocrit 52.8 41.3 - 51.0 %   MCV 87 79 - 97 fL   MCH 29.6 26.6 - 33.0 pg   MCHC 34.0 31.5 - 35.7 g/dL   RDW 24.4 01.0 - 27.2 %   Platelets 302 150 - 450 x10E3/uL   Neutrophils 52 Not Estab. %   Lymphs 40 Not Estab. %   Monocytes 5 Not Estab. %   Eos 2 Not Estab. %   Basos 1 Not Estab. %   Neutrophils Absolute 3.5 1.4 - 7.0 x10E3/uL   Lymphocytes Absolute 2.7 0.7 - 3.1 x10E3/uL   Monocytes Absolute 0.3 0.1 - 0.9 x10E3/uL   EOS (ABSOLUTE) 0.1 0.0 - 0.4 x10E3/uL   Basophils Absolute 0.0 0.0 - 0.2 x10E3/uL   Immature Granulocytes 0 Not Estab. %   Immature Grans (Abs) 0.0 0.0 - 0.1 x10E3/uL  Comprehensive metabolic panel  Result Value Ref Range   Glucose 80 70 - 99 mg/dL   BUN 12 6 - 20 mg/dL   Creatinine, Ser 5.36 0.76 - 1.27 mg/dL   eGFR 644 >03 KV/QQV/9.56   BUN/Creatinine Ratio 16 9 - 20   Sodium 136 134 - 144 mmol/L   Potassium 4.1 3.5 - 5.2 mmol/L   Chloride 98 96 - 106 mmol/L   CO2 22 20 - 29 mmol/L   Calcium 10.0 8.7 - 10.2 mg/dL   Total Protein 8.1 6.0 - 8.5 g/dL   Albumin 5.0 4.1 - 5.1 g/dL   Globulin, Total 3.1 1.5 - 4.5 g/dL   Albumin/Globulin Ratio 1.6 1.2 - 2.2   Bilirubin Total 0.4 0.0 - 1.2 mg/dL   Alkaline Phosphatase 100 44 - 121 IU/L   AST 17 0 - 40 IU/L   ALT 34 0 - 44 IU/L  Bayer DCA Hb A1c Waived  Result Value Ref Range   HB A1C (BAYER DCA - WAIVED) 5.6 4.8 - 5.6 %      Assessment & Plan:   Problem List Items Addressed This Visit       Digestive   GERD (gastroesophageal reflux disease) - Primary    Chronic, exacerbated by stopping medication cold [place] two months ago when traveling.  Advised him not to cold [place] PPI  medication due to risk for rebound effects.  Suspect current symptoms are related to him stopping medication and currently not taking + probably strained shoulders and neck with trying to clear throat.  Will send in refills on Protonix and Bentyl.  Recommend close monitoring of diet trying to reduce spicy food and other foods he notices irritate throat.  Also minimally use Ibuprofen.      Relevant Medications   pantoprazole (PROTONIX) 20 MG tablet   dicyclomine (BENTYL) 20 MG tablet     Follow up plan: Return in about 4 weeks (around 01/02/2023) for Heart burn.

## 2022-12-05 NOTE — Assessment & Plan Note (Signed)
Chronic, exacerbated by stopping medication cold [place] two months ago when traveling.  Advised him not to cold [place] PPI medication due to risk for rebound effects.  Suspect current symptoms are related to him stopping medication and currently not taking + probably strained shoulders and neck with trying to clear throat.  Will send in refills on Protonix and Bentyl.  Recommend close monitoring of diet trying to reduce spicy food and other foods he notices irritate throat.  Also minimally use Ibuprofen.

## 2022-12-05 NOTE — Telephone Encounter (Signed)
Prior authorization was initiated via CoverMyMeds for prescription of Pantoprazole 20 MG. Awaiting determination from patient's insurance.   ZOX:[id number]

## 2022-12-05 NOTE — Patient Instructions (Signed)
Heartburn Heartburn is a type of pain or discomfort that can happen in your throat or chest. It is often described as a burning pain. It may also cause a bad, acid-like taste in your mouth. It may be caused by stomach contents that move back up (reflux) into the part of the body that moves food from your mouth to your stomach (esophagus). Heartburn may feel worse: When you lie down. When you bend over. At night. Follow these instructions at home: Eating and drinking  Avoid certain foods and drinks as told by your doctor. This may include: Coffee and tea, with or without caffeine. Drinks that have alcohol. Energy drinks and sports drinks. Carbonated drinks or sodas. Chocolate and cocoa. Peppermint and mint flavorings. Garlic and onions. Horseradish. Spicy and acidic foods, such as: Peppers. Chili powder and curry powder. Vinegar. Hot sauces and BBQ sauce. Citrus fruit juices and citrus fruits, such as: Oranges. Lemons. Limes. Tomato-based foods, such as: Red sauce and pizza with red sauce. Chili. Salsa. Fried and fatty foods, such as: Donuts. [place] fries and potato chips. High-fat dressings. High-fat meats, such as: Hot dogs and sausage. Rib eye steak. Ham and bacon. High-fat dairy items, such as: Whole milk. Butter. Cream cheese. Eat small meals often. Avoid eating large meals. Avoid drinking large amounts of liquid with your meals. Avoid eating meals during the 2-3 hours before bedtime. Avoid lying down right after you eat. Do not exercise right after you eat. Lifestyle     If you are overweight, lose an amount of weight that is healthy for you. Ask your doctor about a safe weight loss goal. Do not smoke or use any products that contain nicotine or tobacco. These can make your symptoms worse. If you need help quitting, ask your doctor. Wear loose clothes. Do not wear anything tight around your waist. Raise (elevate) the head of your bed about 6 inches (15 cm)  when you sleep. You can use a wedge to do this. Try to lower your stress. If you need help doing this, ask your doctor. Medicines Take over-the-counter and prescription medicines only as told by your doctor. Do not take aspirin or NSAIDs, such as ibuprofen, unless your doctor says it is okay. Stop medicines only as told by your doctor. If you stop taking some medicines too quickly, your symptoms may get worse. General instructions Watch for any changes in your symptoms. Keep all follow-up visits. Contact a doctor if: You have new symptoms. You lose weight and you do not know why. You have trouble swallowing, or it hurts to swallow. You have wheezing or a cough that keeps happening. Your symptoms do not get better with treatment. You have heartburn often for more than 2 weeks. Get help right away if: You have pain in your arms, neck, jaw, teeth, or back all of a sudden. You feel sweaty, dizzy, or light-headed all of a sudden. You have chest pain or shortness of breath. You vomit and your vomit looks like blood or coffee grounds. Your poop (stool) is bloody or black. These symptoms may be an emergency. Get help right away. Call your local emergency services (911 in the [place] Do not wait to see if the symptoms will go away. Do not drive yourself to the hospital. Summary Heartburn is a type of pain that can happen in your throat or chest. It can feel like a burning pain. It may also cause a bad, acid-like taste in your mouth. You may need to avoid certain  foods and drinks to help your symptoms. Ask your doctor what foods and drinks you should avoid. Take over-the-counter and prescription medicines only as told by your doctor. Do not take aspirin or NSAIDs, such as ibuprofen, unless your doctor told you to do so. Contact your doctor if your symptoms do not get better or they get worse. This information is not intended to replace advice given to you by your health care provider. Make sure  you discuss any questions you have with your health care provider. Document Revised: 08/28/2019 Document Reviewed: 08/28/2019 Elsevier Patient Education  2024 [employer].

## 2022-12-06 ENCOUNTER — Telehealth: Payer: Self-pay | Admitting: Nurse Practitioner

## 2022-12-06 NOTE — Telephone Encounter (Signed)
Copied from CRM [id number]. Topic: General - Other >> Dec 06, 2022  2:39 PM Everette C wrote: Reason for CRM: The patient has been directed to contact their PCP and request an alternative prescription for pantoprazole (PROTONIX) 20 MG tablet [[id number]] or completion of prior authorization   Please contact further when possible

## 2023-01-02 NOTE — Progress Notes (Deleted)
There were no vitals taken for this visit.   Subjective:    Patient ID: Devin Rush, male    DOB: 02-02-1991, 32 y.o.   MRN: [id number]  HPI: Devin Rush is a 32 y.o. male  No chief complaint on file.  DYSPHAGIA Has been present since yesterday and early morning today.  Takes Protonix and Bentyl, has been on for about 1 1/2 years -- had stopped Protonix recently for 2 months while in [place] (cold [place] stopped).  Currently is out and still is not taking.  He is also out of Bentyl.  Prior to issues with swallowing was eating "normal [place] food with hot sauce". Duration: days Description of symptom: Has to clear throat and then swallows and feels immediately like something is getting stuck, then when breaths in feels pain radiating to shoulders and into neck.   Onset: Immediately upon swallowing Location of dysphagia: throat Dysphagia to solids only: yes Dysphagia to solids & liquids: no  Frequency:intermittent  Progressively getting worse: no Alleviatiating factors:  clearing throat Provoking factors: unknown Status: fluctuating EGD: no Weight loss: no Sensation of lump in throat: yes Heartburn: yes Odynophagia: no Nausea: yes in the morning Vomiting: no Drooling/nasal regurgitation/food spillage: no Coughing/choking/dysphonia: no Dysarthria: no Hematemesis: no Regurgitation of undigested food/halitosis: yes if eats late at night Chest pain: no     12/05/2022    2:30 PM 07/08/2022    3:29 PM 03/10/2022    8:41 AM 02/02/2021    9:51 AM 11/02/2020   10:14 AM  Depression screen PHQ 2/9  Decreased Interest 1 1 1  0 0  Down, Depressed, Hopeless 1 0 1 0 0  PHQ - 2 Score 2 1 2  0 0  Altered sleeping 0 1 2 1  0  Tired, decreased energy 1 1 1 1 2   Change in appetite 0 1 1 1 1   Feeling bad or failure about yourself  0 0 1 0 0  Trouble concentrating 1 0 1 0 0  Moving slowly or fidgety/restless 0 0 0 0 0  Suicidal thoughts 0 0 0 0 0  PHQ-9 Score 4 4 8 3 3    Difficult doing work/chores Not difficult at all Somewhat difficult Somewhat difficult Not difficult at all Not difficult at all       12/05/2022    2:30 PM 07/08/2022    3:30 PM 03/10/2022    8:41 AM 02/02/2021    9:52 AM  GAD 7 : Generalized Anxiety Score  Nervous, Anxious, on Edge 1 1 1 1   Control/stop worrying 0 1 1 1   Worry too much - different things 1 0 1 1  Trouble relaxing 0 0 1 0  Restless 0 0 1 0  Easily annoyed or irritable 0 1 1 0  Afraid - awful might happen 0 0 1 0  Total GAD 7 Score 2 3 7 3   Anxiety Difficulty Not difficult at all Not difficult at all Somewhat difficult Not difficult at all   Relevant past medical, surgical, family and social history reviewed and updated as indicated. Interim medical history since our last visit reviewed. Allergies and medications reviewed and updated.  Review of Systems  Constitutional:  Negative for activity change, diaphoresis, fatigue and fever.  Respiratory:  Negative for cough, chest tightness, shortness of breath and wheezing.   Cardiovascular:  Negative for chest pain, palpitations and leg swelling.  Gastrointestinal:  Positive for nausea (with heart burn). Negative for abdominal distention, abdominal pain, constipation, diarrhea and vomiting.  Neurological: Negative.   Psychiatric/Behavioral: Negative.      Per HPI unless specifically indicated above     Objective:    There were no vitals taken for this visit.  Wt Readings from Last 3 Encounters:  12/05/22 174 lb 6.4 oz (79.1 kg)  07/08/22 165 lb 9.6 oz (75.1 kg)  03/10/22 179 lb 4.8 oz (81.3 kg)    Physical Exam Vitals and nursing note reviewed.  Constitutional:      General: He is awake. He is not in acute distress.    Appearance: He is well-developed and well-groomed. He is obese. He is not ill-appearing or toxic-appearing.  HENT:     Head: Normocephalic.     Right Ear: Hearing and external ear normal.     Left Ear: Hearing and external ear normal.  Eyes:      General: Lids are normal.     Extraocular Movements: Extraocular movements intact.     Conjunctiva/sclera: Conjunctivae normal.  Neck:     Thyroid: No thyromegaly.     Vascular: No carotid bruit.  Cardiovascular:     Rate and Rhythm: Normal rate and regular rhythm.     Heart sounds: Normal heart sounds. No murmur heard.    No gallop.  Pulmonary:     Effort: No accessory muscle usage or respiratory distress.     Breath sounds: Normal breath sounds.  Abdominal:     General: Bowel sounds are normal. There is no distension.     Palpations: Abdomen is soft.     Tenderness: There is no abdominal tenderness.  Musculoskeletal:     Cervical back: Full passive range of motion without pain.     Right lower leg: No edema.     Left lower leg: No edema.  Lymphadenopathy:     Cervical: No cervical adenopathy.  Skin:    General: Skin is warm.     Capillary Refill: Capillary refill takes less than 2 seconds.  Neurological:     Mental Status: He is alert and oriented to person, place, and time.     Deep Tendon Reflexes: Reflexes are normal and symmetric.     Reflex Scores:      Brachioradialis reflexes are 2+ on the right side and 2+ on the left side.      Patellar reflexes are 2+ on the right side and 2+ on the left side. Psychiatric:        Attention and Perception: Attention normal.        Mood and Affect: Mood normal.        Speech: Speech normal.        Behavior: Behavior normal. Behavior is cooperative.        Thought Content: Thought content normal.   Results for orders placed or performed in visit on 07/08/22  CBC with Differential/Platelet  Result Value Ref Range   WBC 6.6 3.4 - 10.8 x10E3/uL   RBC 5.31 4.14 - 5.80 x10E6/uL   Hemoglobin 15.7 13.0 - 17.7 g/dL   Hematocrit 84.1 32.4 - 51.0 %   MCV 87 79 - 97 fL   MCH 29.6 26.6 - 33.0 pg   MCHC 34.0 31.5 - 35.7 g/dL   RDW 40.1 02.7 - 25.3 %   Platelets 302 150 - 450 x10E3/uL   Neutrophils 52 Not Estab. %   Lymphs 40 Not  Estab. %   Monocytes 5 Not Estab. %   Eos 2 Not Estab. %   Basos 1 Not Estab. %  Neutrophils Absolute 3.5 1.4 - 7.0 x10E3/uL   Lymphocytes Absolute 2.7 0.7 - 3.1 x10E3/uL   Monocytes Absolute 0.3 0.1 - 0.9 x10E3/uL   EOS (ABSOLUTE) 0.1 0.0 - 0.4 x10E3/uL   Basophils Absolute 0.0 0.0 - 0.2 x10E3/uL   Immature Granulocytes 0 Not Estab. %   Immature Grans (Abs) 0.0 0.0 - 0.1 x10E3/uL  Comprehensive metabolic panel  Result Value Ref Range   Glucose 80 70 - 99 mg/dL   BUN 12 6 - 20 mg/dL   Creatinine, Ser 5.18 0.76 - 1.27 mg/dL   eGFR 841 >66 AY/TKZ/6.01   BUN/Creatinine Ratio 16 9 - 20   Sodium 136 134 - 144 mmol/L   Potassium 4.1 3.5 - 5.2 mmol/L   Chloride 98 96 - 106 mmol/L   CO2 22 20 - 29 mmol/L   Calcium 10.0 8.7 - 10.2 mg/dL   Total Protein 8.1 6.0 - 8.5 g/dL   Albumin 5.0 4.1 - 5.1 g/dL   Globulin, Total 3.1 1.5 - 4.5 g/dL   Albumin/Globulin Ratio 1.6 1.2 - 2.2   Bilirubin Total 0.4 0.0 - 1.2 mg/dL   Alkaline Phosphatase 100 44 - 121 IU/L   AST 17 0 - 40 IU/L   ALT 34 0 - 44 IU/L  Bayer DCA Hb A1c Waived  Result Value Ref Range   HB A1C (BAYER DCA - WAIVED) 5.6 4.8 - 5.6 %      Assessment & Plan:   Problem List Items Addressed This Visit       Digestive   GERD (gastroesophageal reflux disease) - Primary     Follow up plan: No follow-ups on file.

## 2023-01-03 ENCOUNTER — Ambulatory Visit: Payer: No Typology Code available for payment source | Admitting: Nurse Practitioner

## 2023-01-03 DIAGNOSIS — K219 Gastro-esophageal reflux disease without esophagitis: Secondary | ICD-10-CM

## 2023-02-13 IMAGING — CR DG CHEST 2V
2 series · 2 of 2 positions shown · non-contrast
Comparison: 03/30/2020

CLINICAL DATA: Chest pain

EXAM:
CHEST - 2 VIEW

[chest pa]
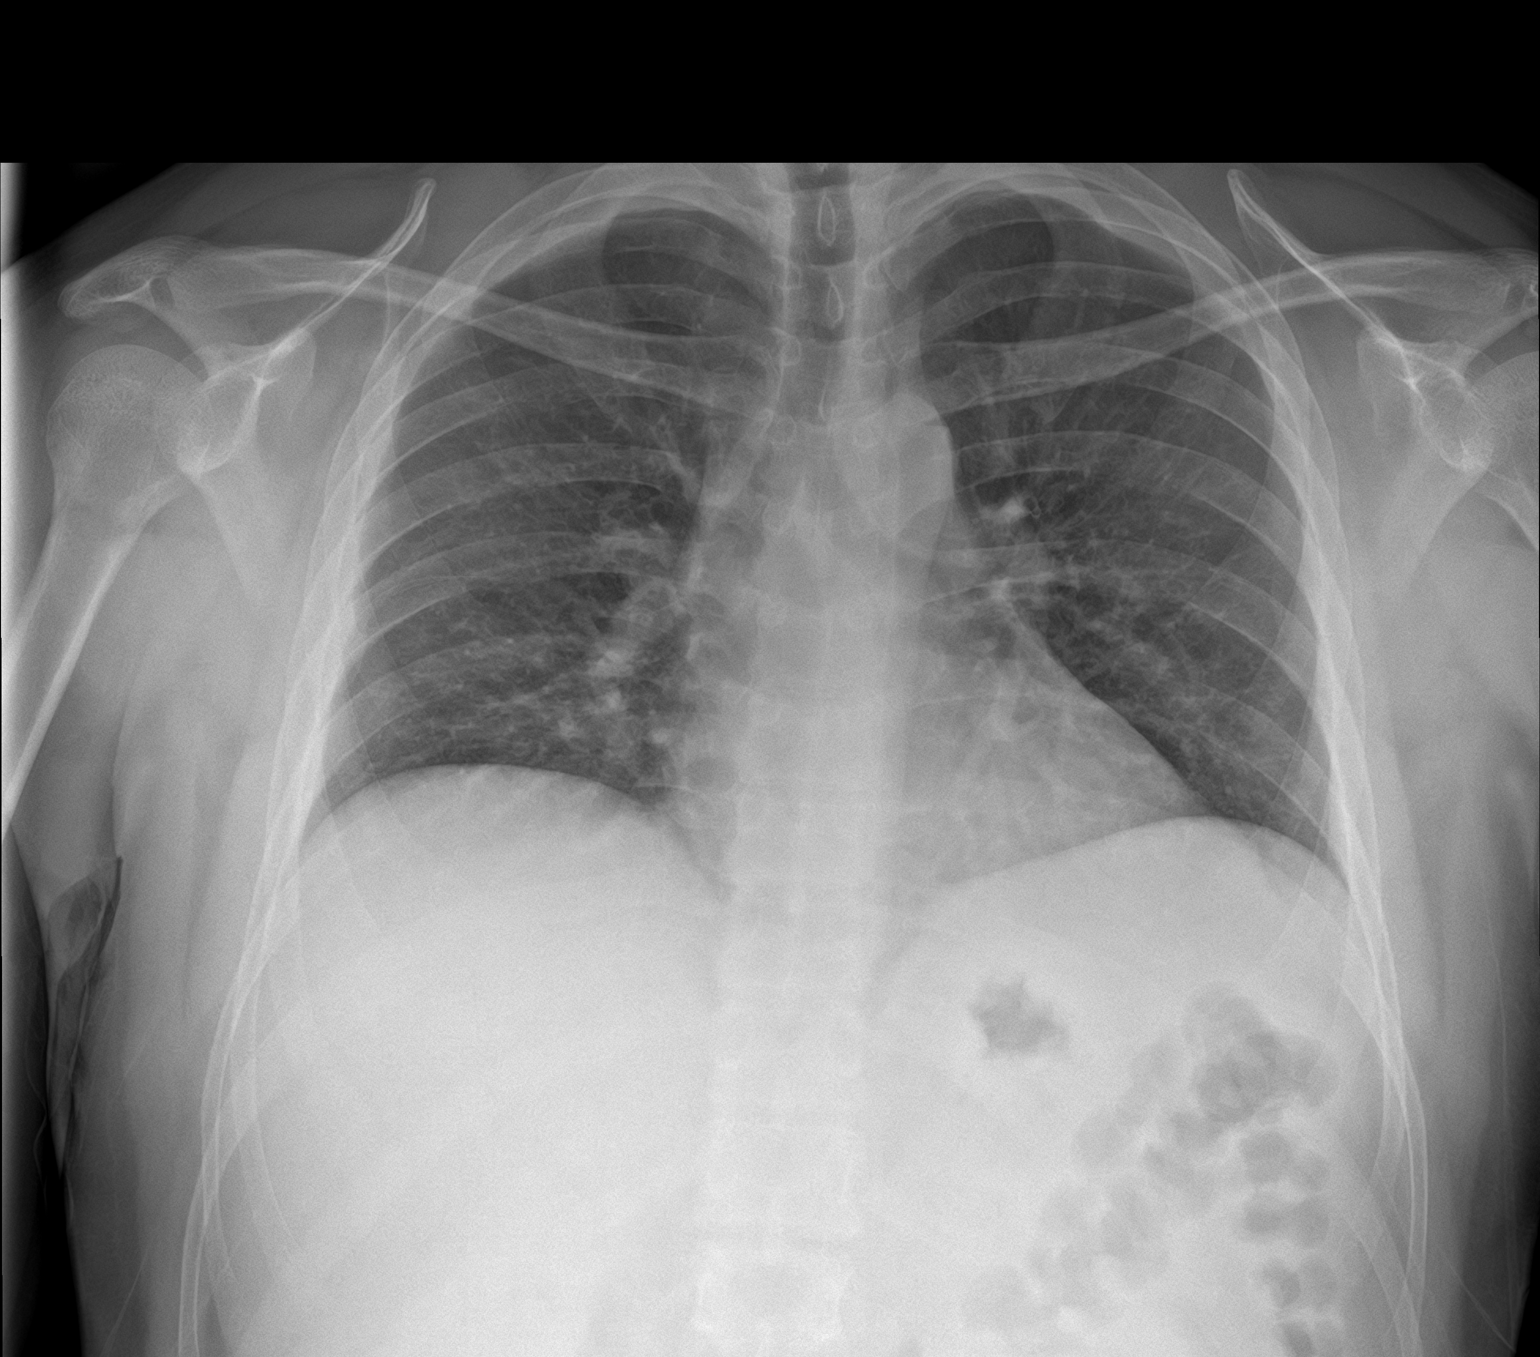

[chest lat]
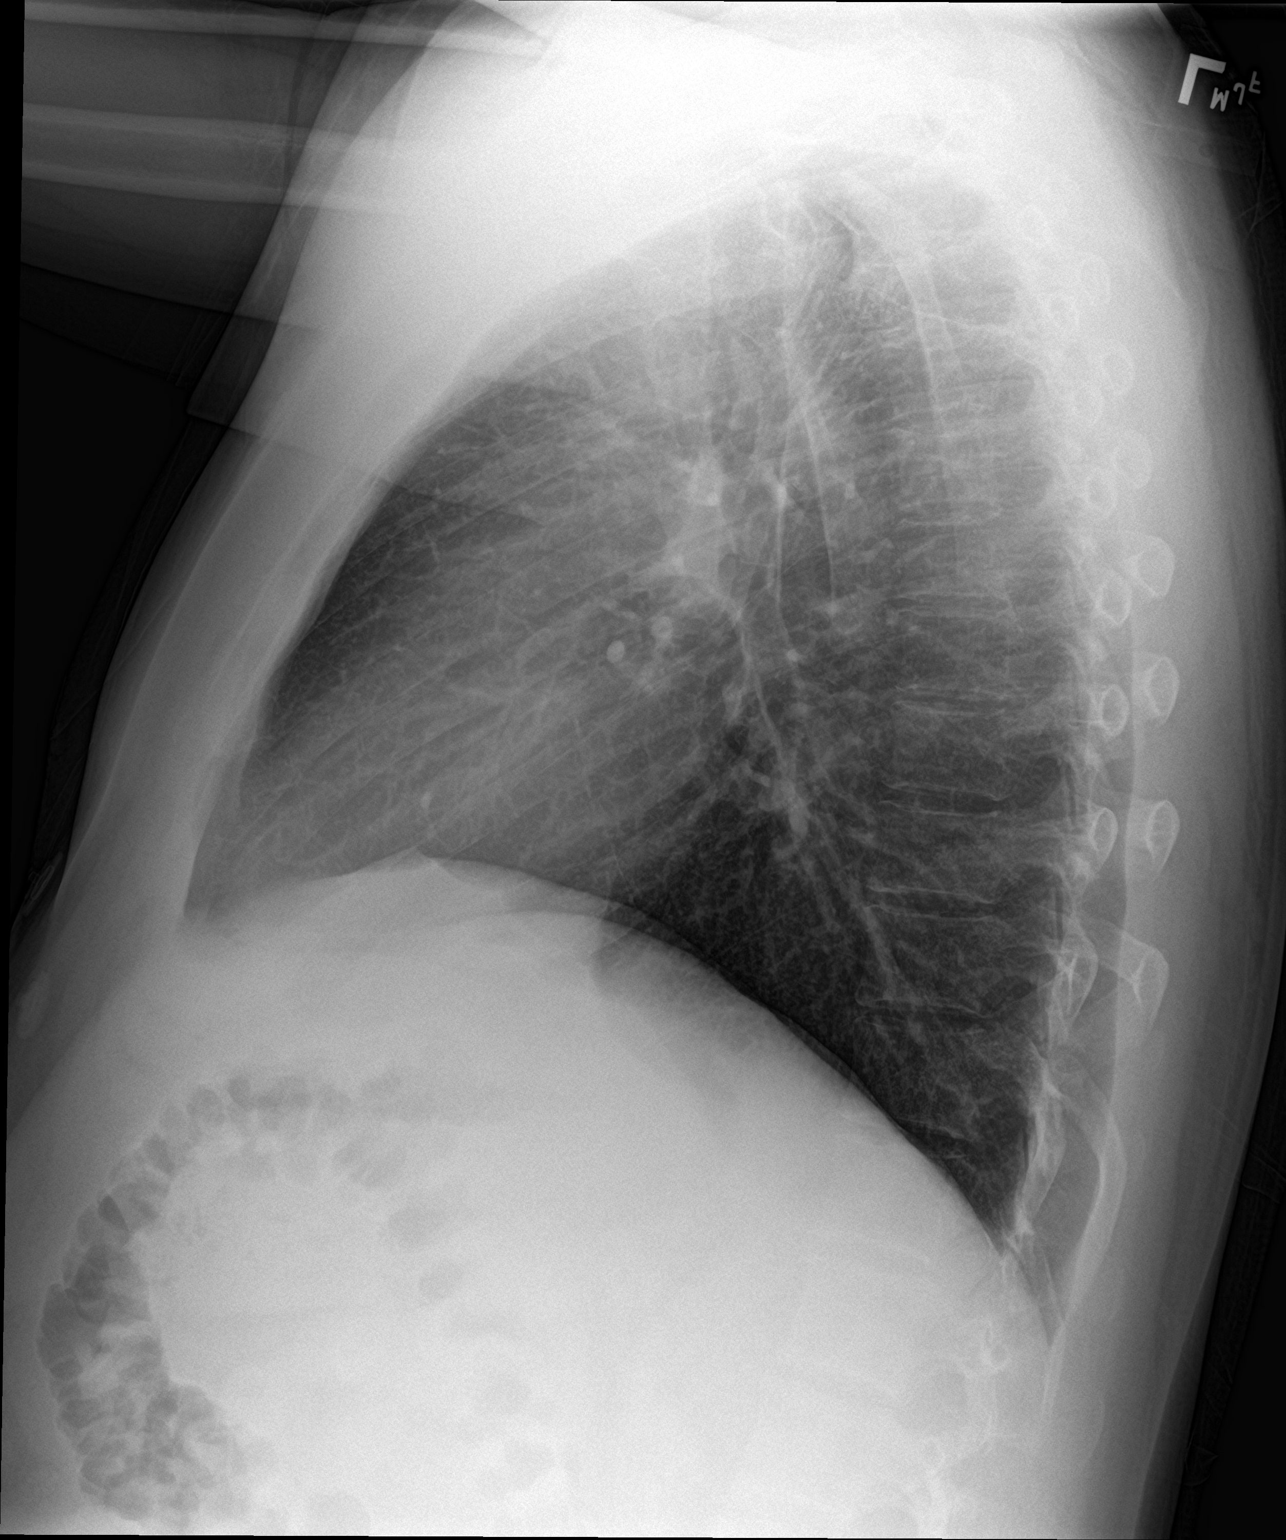

[2 of 2 positions shown; findings below may reference images not displayed]

FINDINGS: The heart size and mediastinal contours are within normal limits.
Both lungs are clear. The visualized skeletal structures are
unremarkable.
IMPRESSION: No active cardiopulmonary disease.

## 2023-08-16 ENCOUNTER — Encounter: Payer: Self-pay | Admitting: Nurse Practitioner

## 2023-08-16 ENCOUNTER — Ambulatory Visit (INDEPENDENT_AMBULATORY_CARE_PROVIDER_SITE_OTHER): Payer: Self-pay | Admitting: Nurse Practitioner

## 2023-08-16 VITALS — BP 126/84 | HR 83 | Temp 98.5°F | Ht 65.0 in | Wt 182.8 lb

## 2023-08-16 DIAGNOSIS — K219 Gastro-esophageal reflux disease without esophagitis: Secondary | ICD-10-CM

## 2023-08-16 DIAGNOSIS — R7303 Prediabetes: Secondary | ICD-10-CM | POA: Diagnosis not present

## 2023-08-16 MED ORDER — OMEPRAZOLE 20 MG PO CPDR: 20.0000 mg | DELAYED_RELEASE_CAPSULE | Freq: Two times a day (BID) | ORAL | 3 refills | Status: AC

## 2023-08-16 NOTE — Assessment & Plan Note (Signed)
 Chronic. Not well controlled.  Has been off of medication for 2 weeks.  Will check H Pylori test.  After test is completed, will start Omeprazole  20mg  BID.  If not improved, will change to pantoprazole  or other approved medication by insurance.  Follow up in 1 month.  Call sooner if concerns arise.

## 2023-08-16 NOTE — Progress Notes (Signed)
 BP 126/84 (BP Location: Left Arm, Patient Position: Sitting, Cuff Size: Large)   Pulse 83   Temp 98.5 F (36.9 C) (Oral)   Ht 5' 5 (1.651 m)   Wt 182 lb 12.8 oz (82.9 kg)   SpO2 98%   BMI 30.42 kg/m    Subjective:    Patient ID: Devin Rush, male    DOB: 1990/08/07, 33 y.o.   MRN: [id number]  HPI: Devin Rush is a 33 y.o. male  Chief Complaint  Patient presents with   Gastroesophageal Reflux    Pt states that GERD is getting worse.    GERD Patient hasn't been taking any medication for his acid reflux for the last two weeks.  States he is feeling and itching and burning in his stomach. Patient states that no matter what he eats he starts to feel like he can't breath.  When he is laying down he feels like he is choking and sometimes feeling palpitations.  GERD control status: uncontrolled Satisfied with current treatment? no Heartburn frequency: daily Medication side effects: no  Medication compliance: worse Previous GERD medications: Antacid use frequency:   Duration:  Nature:  Location:  Heartburn duration:  Alleviatiating factors:   Aggravating factors:  Dysphagia: yes Odynophagia:  yes Hematemesis: no Blood in stool: no EGD: no    Relevant past medical, surgical, family and social history reviewed and updated as indicated. Interim medical history since our last visit reviewed. Allergies and medications reviewed and updated.  Review of Systems  Gastrointestinal:  Positive for abdominal pain.       Acid reflux    Per HPI unless specifically indicated above     Objective:     BP 126/84 (BP Location: Left Arm, Patient Position: Sitting, Cuff Size: Large)   Pulse 83   Temp 98.5 F (36.9 C) (Oral)   Ht 5' 5 (1.651 m)   Wt 182 lb 12.8 oz (82.9 kg)   SpO2 98%   BMI 30.42 kg/m   Wt Readings from Last 3 Encounters:  08/16/23 182 lb 12.8 oz (82.9 kg)  12/05/22 174 lb 6.4 oz (79.1 kg)  07/08/22 165 lb 9.6 oz (75.1 kg)    Physical  Exam Vitals and nursing note reviewed.  Constitutional:      General: He is not in acute distress.    Appearance: Normal appearance. He is not ill-appearing, toxic-appearing or diaphoretic.  HENT:     Head: Normocephalic.     Right Ear: External ear normal.     Left Ear: External ear normal.     Nose: Nose normal. No congestion or rhinorrhea.     Mouth/Throat:     Mouth: Mucous membranes are moist.  Eyes:     General:        Right eye: No discharge.        Left eye: No discharge.     Extraocular Movements: Extraocular movements intact.     Conjunctiva/sclera: Conjunctivae normal.     Pupils: Pupils are equal, round, and reactive to light.  Cardiovascular:     Rate and Rhythm: Normal rate and regular rhythm.     Heart sounds: No murmur heard. Pulmonary:     Effort: Pulmonary effort is normal. No respiratory distress.     Breath sounds: Normal breath sounds. No wheezing, rhonchi or rales.  Abdominal:     General: Abdomen is flat. Bowel sounds are normal.  Musculoskeletal:     Cervical back: Normal range of motion and neck supple.  Skin:    General: Skin is warm and dry.     Capillary Refill: Capillary refill takes less than 2 seconds.  Neurological:     General: No focal deficit present.     Mental Status: He is alert and oriented to person, place, and time.  Psychiatric:        Mood and Affect: Mood normal.        Behavior: Behavior normal.        Thought Content: Thought content normal.        Judgment: Judgment normal.     Results for orders placed or performed in visit on 07/08/22  Bayer DCA Hb A1c Waived   Collection Time: 07/08/22  3:52 PM  Result Value Ref Range   HB A1C (BAYER DCA - WAIVED) 5.6 4.8 - 5.6 %  CBC with Differential/Platelet   Collection Time: 07/08/22  3:54 PM  Result Value Ref Range   WBC 6.6 3.4 - 10.8 x10E3/uL   RBC 5.31 4.14 - 5.80 x10E6/uL   Hemoglobin 15.7 13.0 - 17.7 g/dL   Hematocrit 40.9 81.1 - 51.0 %   MCV 87 79 - 97 fL   MCH 29.6  26.6 - 33.0 pg   MCHC 34.0 31.5 - 35.7 g/dL   RDW 91.4 78.2 - 95.6 %   Platelets 302 150 - 450 x10E3/uL   Neutrophils 52 Not Estab. %   Lymphs 40 Not Estab. %   Monocytes 5 Not Estab. %   Eos 2 Not Estab. %   Basos 1 Not Estab. %   Neutrophils Absolute 3.5 1.4 - 7.0 x10E3/uL   Lymphocytes Absolute 2.7 0.7 - 3.1 x10E3/uL   Monocytes Absolute 0.3 0.1 - 0.9 x10E3/uL   EOS (ABSOLUTE) 0.1 0.0 - 0.4 x10E3/uL   Basophils Absolute 0.0 0.0 - 0.2 x10E3/uL   Immature Granulocytes 0 Not Estab. %   Immature Grans (Abs) 0.0 0.0 - 0.1 x10E3/uL  Comprehensive metabolic panel   Collection Time: 07/08/22  3:54 PM  Result Value Ref Range   Glucose 80 70 - 99 mg/dL   BUN 12 6 - 20 mg/dL   Creatinine, Ser 2.13 0.76 - 1.27 mg/dL   eGFR 086 >57 QI/ONG/2.95   BUN/Creatinine Ratio 16 9 - 20   Sodium 136 134 - 144 mmol/L   Potassium 4.1 3.5 - 5.2 mmol/L   Chloride 98 96 - 106 mmol/L   CO2 22 20 - 29 mmol/L   Calcium 10.0 8.7 - 10.2 mg/dL   Total Protein 8.1 6.0 - 8.5 g/dL   Albumin 5.0 4.1 - 5.1 g/dL   Globulin, Total 3.1 1.5 - 4.5 g/dL   Albumin/Globulin Ratio 1.6 1.2 - 2.2   Bilirubin Total 0.4 0.0 - 1.2 mg/dL   Alkaline Phosphatase 100 44 - 121 IU/L   AST 17 0 - 40 IU/L   ALT 34 0 - 44 IU/L      Assessment & Plan:   Problem List Items Addressed This Visit       Digestive   GERD (gastroesophageal reflux disease) - Primary   Chronic. Not well controlled.  Has been off of medication for 2 weeks.  Will check H Pylori test.  After test is completed, will start Omeprazole  20mg  BID.  If not improved, will change to pantoprazole  or other approved medication by insurance.  Follow up in 1 month.  Call sooner if concerns arise.       Relevant Medications   omeprazole  (PRILOSEC) 20 MG capsule   Other  Relevant Orders   H. pylori antigen, stool   Other Visit Diagnoses       Prediabetes       Labs ordered at visit today.  Will make recommendations based on results.   Relevant Orders   Comp Met  (CMET)   HgB A1c        Follow up plan: Return in about 1 month (around 09/15/2023) for Medication Management for GERD.

## 2023-08-17 ENCOUNTER — Ambulatory Visit: Payer: Self-pay | Admitting: Nurse Practitioner

## 2023-08-17 LAB — HEMOGLOBIN A1C
Est. average glucose Bld gHb Est-mCnc: 128 mg/dL
Hgb A1c MFr Bld: 6.1 % — ABNORMAL HIGH (ref 4.8–5.6)

## 2023-08-17 LAB — COMPREHENSIVE METABOLIC PANEL WITH GFR
ALT: 131 IU/L — ABNORMAL HIGH (ref 0–44)
AST: 51 IU/L — ABNORMAL HIGH (ref 0–40)
Albumin: 4.7 g/dL (ref 4.1–5.1)
Alkaline Phosphatase: 114 IU/L (ref 44–121)
BUN/Creatinine Ratio: 16 (ref 9–20)
BUN: 12 mg/dL (ref 6–20)
Bilirubin Total: 0.3 mg/dL (ref 0.0–1.2)
CO2: 17 mmol/L — ABNORMAL LOW (ref 20–29)
Calcium: 10.3 mg/dL — ABNORMAL HIGH (ref 8.7–10.2)
Chloride: 99 mmol/L (ref 96–106)
Creatinine, Ser: 0.75 mg/dL — ABNORMAL LOW (ref 0.76–1.27)
Globulin, Total: 3.2 g/dL (ref 1.5–4.5)
Glucose: 90 mg/dL (ref 70–99)
Potassium: 4.2 mmol/L (ref 3.5–5.2)
Sodium: 137 mmol/L (ref 134–144)
Total Protein: 7.9 g/dL (ref 6.0–8.5)
eGFR: 123 mL/min/{1.73_m2} (ref >59)

## 2023-08-18 ENCOUNTER — Telehealth: Payer: Self-pay | Admitting: Pharmacy Technician

## 2023-08-18 ENCOUNTER — Other Ambulatory Visit (HOSPITAL_COMMUNITY): Payer: Self-pay

## 2023-08-18 MED ORDER — METRONIDAZOLE 500 MG PO TABS
500.0000 mg | ORAL_TABLET | Freq: Two times a day (BID) | ORAL | 0 refills | Status: AC
Start: 2023-08-18 — End: 2023-09-01

## 2023-08-18 MED ORDER — CLARITHROMYCIN 500 MG PO TABS: 500.0000 mg | ORAL_TABLET | Freq: Two times a day (BID) | ORAL | 0 refills | Status: AC

## 2023-08-18 NOTE — Telephone Encounter (Signed)
 Patient is H pylori positive. It is recommended per the guidelines to take Omeprazole  20mg  BID. Not 40mg  daily.

## 2023-08-18 NOTE — Telephone Encounter (Signed)
 Pharmacy Patient Advocate Encounter   Received notification from Onbase that prior authorization for Omeprazole  20mg  capsules is required/requested.   Insurance verification completed.   The patient is insured through [facility] .   Per test claim:  Omeprazole  40 mg is preferred by the insurance.  If suggested medication is appropriate, Please send in a new RX and discontinue this one. If not, please advise as to why it's not appropriate so that we may request a Prior Authorization. Please note, some preferred medications may still require a PA.  If the suggested medications have not been trialed and there are no contraindications to their use, the PA will not be submitted, as it will not be approved.    Mr. Devin Rush insurance will only pay for 1 capsule daily of omeprazole  20 mg or 1 capsule daily of 40mg  and his copay will be $4.52

## 2023-09-18 ENCOUNTER — Encounter: Payer: Self-pay | Admitting: Nurse Practitioner

## 2023-09-18 ENCOUNTER — Ambulatory Visit (INDEPENDENT_AMBULATORY_CARE_PROVIDER_SITE_OTHER): Admitting: Nurse Practitioner

## 2023-09-18 VITALS — BP 136/87 | HR 85 | Temp 97.8°F | Wt 180.8 lb

## 2023-09-18 DIAGNOSIS — K219 Gastro-esophageal reflux disease without esophagitis: Secondary | ICD-10-CM | POA: Diagnosis not present

## 2023-09-18 NOTE — Progress Notes (Unsigned)
 BP 136/87   Pulse 85   Temp 97.8 F (36.6 C) (Oral)   Wt 180 lb 12.8 oz (82 kg)   SpO2 98%   BMI 30.09 kg/m    Subjective:    Patient ID: Devin Rush, male    DOB: February 20, 1991, 33 y.o.   MRN: [id number]  HPI: Devin Rush is a 33 y.o. male  Chief Complaint  Patient presents with   Gastroesophageal Reflux   GERD Patient states he feels like his symptoms have improved.  Does still have reflux but not nearly as bad as it has been.  Feels like it is controlled with the Omeprazole  BID. GERD control status: Improved Satisfied with current treatment? no Heartburn frequency: daily Medication side effects: no  Medication compliance: worse Previous GERD medications: Antacid use frequency:   Duration:  Nature:  Location:  Heartburn duration:  Alleviatiating factors:   Aggravating factors:  Dysphagia: yes Odynophagia:  yes Hematemesis: no Blood in stool: no EGD: no    Relevant past medical, surgical, family and social history reviewed and updated as indicated. Interim medical history since our last visit reviewed. Allergies and medications reviewed and updated.  Review of Systems  Gastrointestinal:  Positive for abdominal pain.       Acid reflux    Per HPI unless specifically indicated above     Objective:     BP 136/87   Pulse 85   Temp 97.8 F (36.6 C) (Oral)   Wt 180 lb 12.8 oz (82 kg)   SpO2 98%   BMI 30.09 kg/m   Wt Readings from Last 3 Encounters:  09/18/23 180 lb 12.8 oz (82 kg)  08/16/23 182 lb 12.8 oz (82.9 kg)  12/05/22 174 lb 6.4 oz (79.1 kg)    Physical Exam Vitals and nursing note reviewed.  Constitutional:      General: He is not in acute distress.    Appearance: Normal appearance. He is not ill-appearing, toxic-appearing or diaphoretic.  HENT:     Head: Normocephalic.     Right Ear: External ear normal.     Left Ear: External ear normal.     Nose: Nose normal. No congestion or rhinorrhea.     Mouth/Throat:      Mouth: Mucous membranes are moist.  Eyes:     General:        Right eye: No discharge.        Left eye: No discharge.     Extraocular Movements: Extraocular movements intact.     Conjunctiva/sclera: Conjunctivae normal.     Pupils: Pupils are equal, round, and reactive to light.  Cardiovascular:     Rate and Rhythm: Normal rate and regular rhythm.     Heart sounds: No murmur heard. Pulmonary:     Effort: Pulmonary effort is normal. No respiratory distress.     Breath sounds: Normal breath sounds. No wheezing, rhonchi or rales.  Abdominal:     General: Abdomen is flat. Bowel sounds are normal.  Musculoskeletal:     Cervical back: Normal range of motion and neck supple.  Skin:    General: Skin is warm and dry.     Capillary Refill: Capillary refill takes less than 2 seconds.  Neurological:     General: No focal deficit present.     Mental Status: He is alert and oriented to person, place, and time.  Psychiatric:        Mood and Affect: Mood normal.  Behavior: Behavior normal.        Thought Content: Thought content normal.        Judgment: Judgment normal.     Results for orders placed or performed in visit on 08/16/23  Comp Met (CMET)   Collection Time: 08/16/23  1:22 PM  Result Value Ref Range   Glucose 90 70 - 99 mg/dL   BUN 12 6 - 20 mg/dL   Creatinine, Ser 9.24 (L) 0.76 - 1.27 mg/dL   eGFR 876 >40 fO/fpw/8.26   BUN/Creatinine Ratio 16 9 - 20   Sodium 137 134 - 144 mmol/L   Potassium 4.2 3.5 - 5.2 mmol/L   Chloride 99 96 - 106 mmol/L   CO2 17 (L) 20 - 29 mmol/L   Calcium 10.3 (H) 8.7 - 10.2 mg/dL   Total Protein 7.9 6.0 - 8.5 g/dL   Albumin 4.7 4.1 - 5.1 g/dL   Globulin, Total 3.2 1.5 - 4.5 g/dL   Bilirubin Total 0.3 0.0 - 1.2 mg/dL   Alkaline Phosphatase 114 44 - 121 IU/L   AST 51 (H) 0 - 40 IU/L   ALT 131 (H) 0 - 44 IU/L  HgB A1c   Collection Time: 08/16/23  1:22 PM  Result Value Ref Range   Hgb A1c MFr Bld 6.1 (H) 4.8 - 5.6 %   Est. average glucose  Bld gHb Est-mCnc 128 mg/dL  H. pylori antigen, stool   Collection Time: 08/16/23  4:00 PM  Result Value Ref Range   H pylori Ag, Stl Positive (A) Negative      Assessment & Plan:   Problem List Items Addressed This Visit       Digestive   GERD (gastroesophageal reflux disease) - Primary   Treated for H Pylori.  Symptoms have improved from last visit.  Continue with Omeprazole  BID.  Follow up in 3 months.  Call sooner if concerns arise.          Follow up plan: Return in about 3 months (around 12/19/2023) for Physical and Fasting labs.

## 2023-09-19 NOTE — Assessment & Plan Note (Signed)
 Treated for H Pylori.  Symptoms have improved from last visit.  Continue with Omeprazole  BID.  Follow up in 3 months.  Call sooner if concerns arise.

## 2023-11-16 ENCOUNTER — Ambulatory Visit (INDEPENDENT_AMBULATORY_CARE_PROVIDER_SITE_OTHER): Admitting: Nurse Practitioner

## 2023-11-16 ENCOUNTER — Encounter: Payer: Self-pay | Admitting: Nurse Practitioner

## 2023-11-16 ENCOUNTER — Ambulatory Visit: Payer: Self-pay

## 2023-11-16 VITALS — BP 114/71 | HR 76 | Temp 98.8°F | Ht 65.0 in | Wt 178.4 lb

## 2023-11-16 DIAGNOSIS — R079 Chest pain, unspecified: Secondary | ICD-10-CM

## 2023-11-16 DIAGNOSIS — H539 Unspecified visual disturbance: Secondary | ICD-10-CM

## 2023-11-16 NOTE — Progress Notes (Signed)
 BP 114/71   Pulse 76   Temp 98.8 F (37.1 C) (Oral)   Ht 5' 5 (1.651 m)   Wt 178 lb 6.4 oz (80.9 kg)   SpO2 97%   BMI 29.69 kg/m    Subjective:    Patient ID: Devin Rush, male    DOB: [birth year], 33 y.o.   MRN: [id number]  HPI: Devin Rush is a 33 y.o. male  Chief Complaint  Patient presents with   Chest Pain    Patient states he was experiencing off and on chest pains and vision changes yesterday and some this morning. States that the pain felt like pinching.    Patient states he was at work yesterday and put his hands over his eyes and he had blurred vision and saw some rainbow colors and it wouldn't resolve.  He tried to walk but it wouldn't resolve.  His eyes felt watery and had a little pressure.  After that he was having discomfort in his chest.  States that was the only time it happened.  And does feel like he got worked up and that could've caused the chest discomfort.  States when he was driving home he thinks the sun light made him feel light headed. He felt sleepy also when he was driving.  He got home and laid down for like 30 minutes and symptoms resolved.  Coworkers stated that he looked pale.  Denies history of migraines. He feels like he was well hydrated.    Relevant past medical, surgical, family and social history reviewed and updated as indicated. Interim medical history since our last visit reviewed. Allergies and medications reviewed and updated.  Review of Systems  Constitutional:  Positive for fatigue.  Eyes:  Positive for photophobia and visual disturbance.  Cardiovascular:  Positive for chest pain.    Per HPI unless specifically indicated above     Objective:    BP 114/71   Pulse 76   Temp 98.8 F (37.1 C) (Oral)   Ht 5' 5 (1.651 m)   Wt 178 lb 6.4 oz (80.9 kg)   SpO2 97%   BMI 29.69 kg/m   Wt Readings from Last 3 Encounters:  11/16/23 178 lb 6.4 oz (80.9 kg)  09/18/23 180 lb 12.8 oz (82 kg)  08/16/23 182 lb 12.8 oz  (82.9 kg)    Physical Exam Vitals and nursing note reviewed.  Constitutional:      General: He is not in acute distress.    Appearance: Normal appearance. He is not ill-appearing, toxic-appearing or diaphoretic.  HENT:     Head: Normocephalic.     Right Ear: External ear normal.     Left Ear: External ear normal.     Nose: Nose normal. No congestion or rhinorrhea.     Mouth/Throat:     Mouth: Mucous membranes are moist.  Eyes:     General:        Right eye: No discharge.        Left eye: No discharge.     Extraocular Movements: Extraocular movements intact.     Conjunctiva/sclera: Conjunctivae normal.     Pupils: Pupils are equal, round, and reactive to light.  Cardiovascular:     Rate and Rhythm: Normal rate and regular rhythm.     Heart sounds: No murmur heard. Pulmonary:     Effort: Pulmonary effort is normal. No respiratory distress.     Breath sounds: Normal breath sounds. No wheezing, rhonchi or rales.  Abdominal:  General: Abdomen is flat. Bowel sounds are normal.  Musculoskeletal:     Cervical back: Normal range of motion and neck supple.  Skin:    General: Skin is warm and dry.     Capillary Refill: Capillary refill takes less than 2 seconds.  Neurological:     General: No focal deficit present.     Mental Status: He is alert and oriented to person, place, and time.     Cranial Nerves: Cranial nerves 2-12 are intact.     Sensory: Sensation is intact.     Motor: Motor function is intact.     Coordination: Coordination is intact.     Gait: Gait is intact.  Psychiatric:        Mood and Affect: Mood normal.        Behavior: Behavior normal.        Thought Content: Thought content normal.        Judgment: Judgment normal.     Results for orders placed or performed in visit on 08/16/23  Comp Met (CMET)   Collection Time: 08/16/23  1:22 PM  Result Value Ref Range   Glucose 90 70 - 99 mg/dL   BUN 12 6 - 20 mg/dL   Creatinine, Ser 9.24 (L) 0.76 - 1.27 mg/dL    eGFR 876 >40 fO/fpw/8.26   BUN/Creatinine Ratio 16 9 - 20   Sodium 137 134 - 144 mmol/L   Potassium 4.2 3.5 - 5.2 mmol/L   Chloride 99 96 - 106 mmol/L   CO2 17 (L) 20 - 29 mmol/L   Calcium 10.3 (H) 8.7 - 10.2 mg/dL   Total Protein 7.9 6.0 - 8.5 g/dL   Albumin 4.7 4.1 - 5.1 g/dL   Globulin, Total 3.2 1.5 - 4.5 g/dL   Bilirubin Total 0.3 0.0 - 1.2 mg/dL   Alkaline Phosphatase 114 44 - 121 IU/L   AST 51 (H) 0 - 40 IU/L   ALT 131 (H) 0 - 44 IU/L  HgB A1c   Collection Time: 08/16/23  1:22 PM  Result Value Ref Range   Hgb A1c MFr Bld 6.1 (H) 4.8 - 5.6 %   Est. average glucose Bld gHb Est-mCnc 128 mg/dL  H. pylori antigen, stool   Collection Time: 08/16/23  4:00 PM  Result Value Ref Range   H pylori Ag, Stl Positive (A) Negative      Assessment & Plan:   Problem List Items Addressed This Visit   None Visit Diagnoses       Chest pain, unspecified type    -  Primary   EKG showed NSR.   Relevant Orders   EKG 12-Lead     Vision changes       Possibly ocular migraine. Symptoms have resolved. If symptoms return, recommend following up for possible imaging.        Follow up plan: No follow-ups on file.

## 2023-11-16 NOTE — Telephone Encounter (Signed)
 FYI Only or Action Required?: FYI only for provider.  Patient was last seen in primary care on 09/18/2023 by Melvin Pao, NP.  Called Nurse Triage reporting Eye Problem and Chest Pain.  Symptoms began yesterday.-chest pain lasts at most for a minute and intermittent in nature  Interventions attempted: Rest, hydration, or home remedies.  Symptoms are: unchanged.  Triage Disposition: See PCP When Office is Open (Within 3 Days), See Physician Within 24 Hours  Patient/caregiver understands and will follow disposition?: Yes  Copied from CRM #[id number]. Topic: Clinical - Red Word Triage >> Nov 16, 2023  8:46 AM Leila BROCKS wrote: Red Word that prompted transfer to Nurse Triage: Patient [phone] states chest pain yesterday, mouth dry, lightheaded, vision issues like different colors. Patient denies fever, nor  shortness of breath. Patient wants to see pcp NP, [place]. Informed patient called the wrong office, this [facility] pulmonary. Please advise. Reason for Disposition  [1] Chest pain lasts < 5 minutes AND [2] NO chest pain or cardiac symptoms (e.g., breathing difficulty, sweating) now  (Exception: Chest pains that last only a few seconds.)  [1] Brief (now gone) blurred vision AND [2] unexplained  Answer Assessment - Initial Assessment Questions 1. DESCRIPTION: How has your vision changed? (e.g., complete vision loss, blurred vision, double vision, floaters, etc.)     Patient reports that he was seeing different colors-(described as a rainbow) to his peripheral vision 2. LOCATION: One or both eyes? If one, ask: Which eye?     Both eyes 3. SEVERITY: Can you see anything? If Yes, ask: What can you see? (e.g., fine print)     Was able to see  4. ONSET: When did this begin? Did it start suddenly or has this been gradual?     Yesterday and only last 5 minutes 5. PATTERN: Does this come and go, or has it been constant since it started?     Only last five minutes 6.  PAIN: Is there any pain in your eye(s)?  (Scale 1-10; or mild, moderate, severe)     no 7. CONTACTS-GLASSES: Do you wear contacts or glasses?     no 8. CAUSE: What do you think is causing this visual problem?     unsure 9. OTHER SYMPTOMS: Do you have any other symptoms? (e.g., confusion, headache, arm or leg weakness, speech problems)     Developed chest pain after the period of vision change ended.  Answer Assessment - Initial Assessment Questions 1. LOCATION: Where does it hurt?       Towards upper left side of chest 2. RADIATION: Does the pain go anywhere else? (e.g., into neck, jaw, arms, back)     Radiates into the back of head 3. ONSET: When did the chest pain begin? (Minutes, hours or days)      Started yesterday 4. PATTERN: Does the pain come and go, or has it been constant since it started?  Does it get worse with exertion?      Comes and goes.  5. DURATION: How long does it last (e.g., seconds, minutes, hours)     Lasts for a minute 6. SEVERITY: How bad is the pain?  (e.g., Scale 1-10; mild, moderate, or severe)     4 out of 10 7. CARDIAC RISK FACTORS: Do you have any history of heart problems or risk factors for heart disease? (e.g., angina, prior heart attack; diabetes, high blood pressure, high cholesterol, smoker, or strong family history of heart disease)     Hx of heart  burn 8. PULMONARY RISK FACTORS: Do you have any history of lung disease?  (e.g., blood clots in lung, asthma, emphysema, birth control pills)     no 9. CAUSE: What do you think is causing the chest pain?     unsure 10. OTHER SYMPTOMS: Do you have any other symptoms? (e.g., dizziness, nausea, vomiting, sweating, fever, difficulty breathing, cough)       Lightheadedness yesterday-no symptoms today  Protocols used: Vision Loss or Change-A-AH, Chest Pain-A-AH

## 2023-11-17 ENCOUNTER — Ambulatory Visit: Payer: Self-pay | Admitting: Nurse Practitioner

## 2023-12-20 ENCOUNTER — Encounter: Payer: Self-pay | Admitting: Nurse Practitioner

## 2023-12-20 ENCOUNTER — Ambulatory Visit: Admitting: Nurse Practitioner

## 2023-12-20 VITALS — BP 125/68 | HR 84 | Temp 98.6°F | Ht 65.0 in | Wt 179.4 lb

## 2023-12-20 DIAGNOSIS — R7303 Prediabetes: Secondary | ICD-10-CM | POA: Insufficient documentation

## 2023-12-20 DIAGNOSIS — Z0000 Encounter for general adult medical examination without abnormal findings: Secondary | ICD-10-CM

## 2023-12-20 DIAGNOSIS — F419 Anxiety disorder, unspecified: Secondary | ICD-10-CM | POA: Diagnosis not present

## 2023-12-20 DIAGNOSIS — F32A Depression, unspecified: Secondary | ICD-10-CM

## 2023-12-20 DIAGNOSIS — K219 Gastro-esophageal reflux disease without esophagitis: Secondary | ICD-10-CM

## 2023-12-20 DIAGNOSIS — E782 Mixed hyperlipidemia: Secondary | ICD-10-CM | POA: Diagnosis not present

## 2023-12-20 MED ORDER — BUSPIRONE HCL 10 MG PO TABS: 10.0000 mg | ORAL_TABLET | Freq: Two times a day (BID) | ORAL | 1 refills | Status: AC

## 2023-12-20 NOTE — Assessment & Plan Note (Signed)
Chronic.  Controlled.  Continue with current medication regimen of Omeprazole.  Refills sent today.  Labs ordered today.  Return to clinic in 6 months for reevaluation.  Call sooner if concerns arise.

## 2023-12-20 NOTE — Progress Notes (Signed)
 BP 125/68   Pulse 84   Temp 98.6 F (37 C) (Oral)   Ht 5' 5 (1.651 m)   Wt 179 lb 6.4 oz (81.4 kg)   SpO2 98%   BMI 29.85 kg/m    Subjective:    Patient ID: Devin Rush, male    DOB: 07/29/1990, 33 y.o.   MRN: [id number]  HPI: Devin Rush is a 33 y.o. male presenting on 12/20/2023 for comprehensive medical examination. Current medical complaints include:none  He currently lives with: Interim Problems from his last visit: no  MOOD Patient states he feels like he needs something for his anxiety as needed.  He was taking sertraline  as needed but ran out.  Is open to other options.   Depression Screen done today and results listed below:     12/20/2023    2:56 PM 09/18/2023    4:05 PM 08/16/2023    1:14 PM 12/05/2022    2:30 PM 07/08/2022    3:29 PM  Depression screen PHQ 2/9  Decreased Interest 0 0 1 1 1   Down, Depressed, Hopeless 1 0 1 1 0  PHQ - 2 Score 1 0 2 2 1   Altered sleeping 0 0 1 0 1  Tired, decreased energy 1 0 1 1 1   Change in appetite 0 0 1 0 1  Feeling bad or failure about yourself  0 0 1 0 0  Trouble concentrating 1 0 0 1 0  Moving slowly or fidgety/restless 0 0 0 0 0  Suicidal thoughts 0 0  0 0  PHQ-9 Score 3 0 6 4 4   Difficult doing work/chores Not difficult at all Not difficult at all Somewhat difficult Not difficult at all Somewhat difficult    The patient does not have a history of falls. I did complete a risk assessment for falls. A plan of care for falls was documented.   Past Medical History:  Past Medical History:  Diagnosis Date   Anxiety     Surgical History:  Past Surgical History:  Procedure Laterality Date   APPENDECTOMY      Medications:  Current Outpatient Medications on File Prior to Visit  Medication Sig   ibuprofen  (ADVIL ) 800 MG tablet Take 1 tablet (800 mg total) by mouth every 8 (eight) hours as needed for mild pain.   omeprazole  (PRILOSEC) 20 MG capsule Take 1 capsule (20 mg total) by mouth 2 (two)  times daily before a meal.   No current facility-administered medications on file prior to visit.    Allergies:  No Known Allergies  Social History:  Social History   Socioeconomic History   Marital status: Single    Spouse name: Not on file   Number of children: Not on file   Years of education: Not on file   Highest education level: 12th grade  Occupational History   Not on file  Tobacco Use   Smoking status: Never   Smokeless tobacco: Never  Vaping Use   Vaping status: Never Used  Substance and Sexual Activity   Alcohol use: Yes    Comment: on occasion   Drug use: Never   Sexual activity: Yes  Other Topics Concern   Not on file  Social History Narrative   Not on file   Social Drivers of Health   Financial Resource Strain: Medium Risk (12/20/2023)   Overall Financial Resource Strain (CARDIA)    Difficulty of Paying Living Expenses: Somewhat hard  Food Insecurity: No Food Insecurity (12/20/2023)  Hunger Vital Sign    Worried About Running Out of Food in the Last Year: Never true    Ran Out of Food in the Last Year: Never true  Transportation Needs: No Transportation Needs (12/20/2023)   PRAPARE - Administrator, Civil Service (Medical): No    Lack of Transportation (Non-Medical): No  Physical Activity: Insufficiently Active (12/20/2023)   Exercise Vital Sign    Days of Exercise per Week: 2 days    Minutes of Exercise per Session: 10 min  Stress: Stress Concern Present (12/20/2023)   [employer] of Occupational Health - Occupational Stress Questionnaire    Feeling of Stress: To some extent  Social Connections: Unknown (12/20/2023)   Social Connection and Isolation Panel    Frequency of Communication with Friends and Family: More than three times a week    Frequency of Social Gatherings with Friends and Family: Twice a week    Attends Religious Services: More than 4 times per year    Active Member of [employer] or Organizations: No    Attends  Banker Meetings: Not on file    Marital Status: Patient declined  Intimate Partner Violence: Not At Risk (12/20/2023)   Humiliation, Afraid, Rape, and Kick questionnaire    Fear of Current or Ex-Partner: No    Emotionally Abused: No    Physically Abused: No    Sexually Abused: No   Social History   Tobacco Use  Smoking Status Never  Smokeless Tobacco Never   Social History   Substance and Sexual Activity  Alcohol Use Yes   Comment: on occasion    Family History:  Family History  Problem Relation Age of Onset   Kidney disease Mother    Hypertension Mother    Diabetes Father    Blindness Father    Diabetes Maternal Grandmother    Blindness Paternal Grandmother    Kidney disease Paternal Grandmother    Kidney disease Paternal Grandfather     Past medical history, surgical history, medications, allergies, family history and social history reviewed with patient today and changes made to appropriate areas of the chart.   Review of Systems  Gastrointestinal:  Positive for heartburn.  Psychiatric/Behavioral:  The patient is nervous/anxious.    All other ROS negative except what is listed above and in the HPI.      Objective:    BP 125/68   Pulse 84   Temp 98.6 F (37 C) (Oral)   Ht 5' 5 (1.651 m)   Wt 179 lb 6.4 oz (81.4 kg)   SpO2 98%   BMI 29.85 kg/m   Wt Readings from Last 3 Encounters:  12/20/23 179 lb 6.4 oz (81.4 kg)  11/16/23 178 lb 6.4 oz (80.9 kg)  09/18/23 180 lb 12.8 oz (82 kg)    Physical Exam Vitals and nursing note reviewed.  Constitutional:      General: He is not in acute distress.    Appearance: Normal appearance. He is not ill-appearing, toxic-appearing or diaphoretic.  HENT:     Head: Normocephalic.     Right Ear: Tympanic membrane, ear canal and external ear normal.     Left Ear: Tympanic membrane, ear canal and external ear normal.     Nose: Nose normal. No congestion or rhinorrhea.     Mouth/Throat:     Mouth: Mucous  membranes are moist.  Eyes:     General:        Right eye: No discharge.  Left eye: No discharge.     Extraocular Movements: Extraocular movements intact.     Conjunctiva/sclera: Conjunctivae normal.     Pupils: Pupils are equal, round, and reactive to light.  Cardiovascular:     Rate and Rhythm: Normal rate and regular rhythm.     Heart sounds: No murmur heard. Pulmonary:     Effort: Pulmonary effort is normal. No respiratory distress.     Breath sounds: Normal breath sounds. No wheezing, rhonchi or rales.  Abdominal:     General: Abdomen is flat. Bowel sounds are normal. There is no distension.     Palpations: Abdomen is soft.     Tenderness: There is no abdominal tenderness. There is no guarding.  Musculoskeletal:     Cervical back: Normal range of motion and neck supple.  Skin:    General: Skin is warm and dry.     Capillary Refill: Capillary refill takes less than 2 seconds.  Neurological:     General: No focal deficit present.     Mental Status: He is alert and oriented to person, place, and time.     Cranial Nerves: No cranial nerve deficit.     Motor: No weakness.     Deep Tendon Reflexes: Reflexes normal.  Psychiatric:        Mood and Affect: Mood normal.        Behavior: Behavior normal.        Thought Content: Thought content normal.        Judgment: Judgment normal.     Results for orders placed or performed in visit on 08/16/23  Comp Met (CMET)   Collection Time: 08/16/23  1:22 PM  Result Value Ref Range   Glucose 90 70 - 99 mg/dL   BUN 12 6 - 20 mg/dL   Creatinine, Ser 9.24 (L) 0.76 - 1.27 mg/dL   eGFR 876 >40 fO/fpw/8.26   BUN/Creatinine Ratio 16 9 - 20   Sodium 137 134 - 144 mmol/L   Potassium 4.2 3.5 - 5.2 mmol/L   Chloride 99 96 - 106 mmol/L   CO2 17 (L) 20 - 29 mmol/L   Calcium 10.3 (H) 8.7 - 10.2 mg/dL   Total Protein 7.9 6.0 - 8.5 g/dL   Albumin 4.7 4.1 - 5.1 g/dL   Globulin, Total 3.2 1.5 - 4.5 g/dL   Bilirubin Total 0.3 0.0 - 1.2  mg/dL   Alkaline Phosphatase 114 44 - 121 IU/L   AST 51 (H) 0 - 40 IU/L   ALT 131 (H) 0 - 44 IU/L  HgB A1c   Collection Time: 08/16/23  1:22 PM  Result Value Ref Range   Hgb A1c MFr Bld 6.1 (H) 4.8 - 5.6 %   Est. average glucose Bld gHb Est-mCnc 128 mg/dL  H. pylori antigen, stool   Collection Time: 08/16/23  4:00 PM  Result Value Ref Range   H pylori Ag, Stl Positive (A) Negative      Assessment & Plan:   Problem List Items Addressed This Visit       Digestive   GERD (gastroesophageal reflux disease)   Chronic.  Controlled.  Continue with current medication regimen of Omeprazole .  Refills sent today.  Labs ordered today.  Return to clinic in 6 months for reevaluation.  Call sooner if concerns arise.          Other   Anxiety and depression   Chronic. Anxiety is under okay control. Does feel like he would benefit from something PRN.  Will start Buspar 10mg  BID PRN.  Side effects and benefits of medication discussed.  Follow up in 6 months.  Call sooner if concerns arise.       Relevant Medications   busPIRone (BUSPAR) 10 MG tablet   Mixed hyperlipidemia   Labs ordered at visit today.  Will make recommendations based on lab results.        Relevant Orders   Lipid panel   Prediabetes   Labs ordered at visit today.  Will make recommendations based on lab results.        Relevant Orders   HgB A1c   Other Visit Diagnoses       Annual physical exam    -  Primary   Health maintenance reviewed during visit today.  labs ordered.  Vaccines reviewed.   Relevant Orders   TSH   Lipid panel   CBC with Differential/Platelet   Comprehensive metabolic panel with GFR        Discussed aspirin prophylaxis for myocardial infarction prevention and decision was it was not indicated  LABORATORY TESTING:  Health maintenance labs ordered today as discussed above.     IMMUNIZATIONS:   - Tdap: Tetanus vaccination status reviewed: last tetanus booster within 10 years. -  Influenza: Refused - Pneumovax: Not applicable - Prevnar: Not applicable - COVID: Not applicable - HPV: Not applicable - Shingrix vaccine: Not applicable  SCREENING: - Colonoscopy: Not applicable  Discussed with patient purpose of the colonoscopy is to detect colon cancer at curable precancerous or early stages   - AAA Screening: Not applicable  -Hearing Test: Not applicable  -Spirometry: Not applicable   PATIENT COUNSELING:    Sexuality: Discussed sexually transmitted diseases, partner selection, use of condoms, avoidance of unintended pregnancy  and contraceptive alternatives.   Advised to avoid cigarette smoking.  I discussed with the patient that most people either abstain from alcohol or drink within safe limits (<=14/week and <=4 drinks/occasion for males, <=7/weeks and <= 3 drinks/occasion for females) and that the risk for alcohol disorders and other health effects rises proportionally with the number of drinks per week and how often a drinker exceeds daily limits.  Discussed cessation/primary prevention of drug use and availability of treatment for abuse.   Diet: Encouraged to adjust caloric intake to maintain  or achieve ideal body weight, to reduce intake of dietary saturated fat and total fat, to limit sodium intake by avoiding high sodium foods and not adding table salt, and to maintain adequate dietary potassium and calcium preferably from fresh fruits, vegetables, and low-fat dairy products.    stressed the importance of regular exercise  Injury prevention: Discussed safety belts, safety helmets, smoke detector, smoking near bedding or upholstery.   Dental health: Discussed importance of regular tooth brushing, flossing, and dental visits.   Follow up plan: NEXT PREVENTATIVE PHYSICAL DUE IN 1 YEAR. Return in about 6 months (around 06/19/2024) for HTN, HLD, DM2 FU.

## 2023-12-20 NOTE — Assessment & Plan Note (Signed)
 Chronic. Anxiety is under okay control. Does feel like he would benefit from something PRN.  Will start Buspar 10mg  BID PRN.  Side effects and benefits of medication discussed.  Follow up in 6 months.  Call sooner if concerns arise.

## 2023-12-20 NOTE — Assessment & Plan Note (Signed)
 Labs ordered at visit today.  Will make recommendations based on lab results.

## 2023-12-21 LAB — CBC WITH DIFFERENTIAL/PLATELET
Basophils Absolute: 0 x10E3/uL (ref 0.0–0.2)
Basos: 1 %
EOS (ABSOLUTE): 0.1 x10E3/uL (ref 0.0–0.4)
Eos: 1 %
Hematocrit: 45.7 % (ref 37.5–51.0)
Hemoglobin: 15.1 g/dL (ref 13.0–17.7)
Immature Grans (Abs): 0 x10E3/uL (ref 0.0–0.1)
Immature Granulocytes: 0 %
Lymphocytes Absolute: 2.1 x10E3/uL (ref 0.7–3.1)
Lymphs: 39 %
MCH: 29.7 pg (ref 26.6–33.0)
MCHC: 33 g/dL (ref 31.5–35.7)
MCV: 90 fL (ref 79–97)
Monocytes Absolute: 0.3 x10E3/uL (ref 0.1–0.9)
Monocytes: 5 %
Neutrophils Absolute: 2.9 x10E3/uL (ref 1.4–7.0)
Neutrophils: 54 %
Platelets: 278 x10E3/uL (ref 150–450)
RBC: 5.09 x10E6/uL (ref 4.14–5.80)
RDW: 13.3 % (ref 11.6–15.4)
WBC: 5.4 x10E3/uL (ref 3.4–10.8)

## 2023-12-21 LAB — COMPREHENSIVE METABOLIC PANEL WITH GFR
ALT: 99 IU/L — ABNORMAL HIGH (ref 0–44)
AST: 45 IU/L — ABNORMAL HIGH (ref 0–40)
Albumin: 4.6 g/dL (ref 4.1–5.1)
Alkaline Phosphatase: 99 IU/L (ref 47–123)
BUN/Creatinine Ratio: 22 — ABNORMAL HIGH (ref 9–20)
BUN: 15 mg/dL (ref 6–20)
Bilirubin Total: 0.5 mg/dL (ref 0.0–1.2)
CO2: 23 mmol/L (ref 20–29)
Calcium: 9.7 mg/dL (ref 8.7–10.2)
Chloride: 98 mmol/L (ref 96–106)
Creatinine, Ser: 0.68 mg/dL — ABNORMAL LOW (ref 0.76–1.27)
Globulin, Total: 3 g/dL (ref 1.5–4.5)
Glucose: 108 mg/dL — ABNORMAL HIGH (ref 70–99)
Potassium: 3.9 mmol/L (ref 3.5–5.2)
Sodium: 136 mmol/L (ref 134–144)
Total Protein: 7.6 g/dL (ref 6.0–8.5)
eGFR: 127 mL/min/1.73 (ref >59)

## 2023-12-21 LAB — LIPID PANEL
Chol/HDL Ratio: 9.9 ratio — ABNORMAL HIGH (ref 0.0–5.0)
Cholesterol, Total: 267 mg/dL — ABNORMAL HIGH (ref 100–199)
HDL: 27 mg/dL — ABNORMAL LOW (ref >39)
LDL Chol Calc (NIH): 121 mg/dL — ABNORMAL HIGH (ref 0–99)
Triglycerides: 658 mg/dL (ref 0–149)
VLDL Cholesterol Cal: 119 mg/dL — ABNORMAL HIGH (ref 5–40)

## 2023-12-21 LAB — HEMOGLOBIN A1C
Est. average glucose Bld gHb Est-mCnc: 120 mg/dL
Hgb A1c MFr Bld: 5.8 % — ABNORMAL HIGH (ref 4.8–5.6)

## 2023-12-21 LAB — TSH: TSH: 1.64 u[IU]/mL (ref 0.450–4.500)

## 2023-12-25 ENCOUNTER — Ambulatory Visit: Payer: Self-pay | Admitting: Nurse Practitioner

## 2024-01-11 ENCOUNTER — Ambulatory Visit: Admitting: Nurse Practitioner

## 2024-01-23 ENCOUNTER — Ambulatory Visit: Admitting: Nurse Practitioner

## 2024-01-23 NOTE — Progress Notes (Deleted)
 There were no vitals taken for this visit.   Subjective:    Patient ID: Devin Rush, male    DOB: 08/22/1990, 33 y.o.   MRN: [id number]  HPI: Devin Rush is a 33 y.o. male  No chief complaint on file.   Relevant past medical, surgical, family and social history reviewed and updated as indicated. Interim medical history since our last visit reviewed. Allergies and medications reviewed and updated.  Review of Systems  Per HPI unless specifically indicated above     Objective:    There were no vitals taken for this visit.  Wt Readings from Last 3 Encounters:  12/20/23 179 lb 6.4 oz (81.4 kg)  11/16/23 178 lb 6.4 oz (80.9 kg)  09/18/23 180 lb 12.8 oz (82 kg)    Physical Exam  Results for orders placed or performed in visit on 12/20/23  TSH   Collection Time: 12/20/23  3:12 PM  Result Value Ref Range   TSH 1.640 0.450 - 4.500 uIU/mL  Lipid panel   Collection Time: 12/20/23  3:12 PM  Result Value Ref Range   Cholesterol, Total 267 (H) 100 - 199 mg/dL   Triglycerides 341 (HH) 0 - 149 mg/dL   HDL 27 (L) >60 mg/dL   VLDL Cholesterol Cal 119 (H) 5 - 40 mg/dL   LDL Chol Calc (NIH) 878 (H) 0 - 99 mg/dL   Chol/HDL Ratio 9.9 (H) 0.0 - 5.0 ratio  CBC with Differential/Platelet   Collection Time: 12/20/23  3:12 PM  Result Value Ref Range   WBC 5.4 3.4 - 10.8 x10E3/uL   RBC 5.09 4.14 - 5.80 x10E6/uL   Hemoglobin 15.1 13.0 - 17.7 g/dL   Hematocrit 54.2 62.4 - 51.0 %   MCV 90 79 - 97 fL   MCH 29.7 26.6 - 33.0 pg   MCHC 33.0 31.5 - 35.7 g/dL   RDW 86.6 88.3 - 84.5 %   Platelets 278 150 - 450 x10E3/uL   Neutrophils 54 Not Estab. %   Lymphs 39 Not Estab. %   Monocytes 5 Not Estab. %   Eos 1 Not Estab. %   Basos 1 Not Estab. %   Neutrophils Absolute 2.9 1.4 - 7.0 x10E3/uL   Lymphocytes Absolute 2.1 0.7 - 3.1 x10E3/uL   Monocytes Absolute 0.3 0.1 - 0.9 x10E3/uL   EOS (ABSOLUTE) 0.1 0.0 - 0.4 x10E3/uL   Basophils Absolute 0.0 0.0 - 0.2 x10E3/uL   Immature  Granulocytes 0 Not Estab. %   Immature Grans (Abs) 0.0 0.0 - 0.1 x10E3/uL  Comprehensive metabolic panel with GFR   Collection Time: 12/20/23  3:12 PM  Result Value Ref Range   Glucose 108 (H) 70 - 99 mg/dL   BUN 15 6 - 20 mg/dL   Creatinine, Ser 9.31 (L) 0.76 - 1.27 mg/dL   eGFR 872 >40 fO/fpw/8.26   BUN/Creatinine Ratio 22 (H) 9 - 20   Sodium 136 134 - 144 mmol/L   Potassium 3.9 3.5 - 5.2 mmol/L   Chloride 98 96 - 106 mmol/L   CO2 23 20 - 29 mmol/L   Calcium 9.7 8.7 - 10.2 mg/dL   Total Protein 7.6 6.0 - 8.5 g/dL   Albumin 4.6 4.1 - 5.1 g/dL   Globulin, Total 3.0 1.5 - 4.5 g/dL   Bilirubin Total 0.5 0.0 - 1.2 mg/dL   Alkaline Phosphatase 99 47 - 123 IU/L   AST 45 (H) 0 - 40 IU/L   ALT 99 (H) 0 - 44 IU/L  HgB A1c   Collection Time: 12/20/23  3:12 PM  Result Value Ref Range   Hgb A1c MFr Bld 5.8 (H) 4.8 - 5.6 %   Est. average glucose Bld gHb Est-mCnc 120 mg/dL      Assessment & Plan:   Problem List Items Addressed This Visit   None    Follow up plan: No follow-ups on file.
